# Patient Record
Sex: Female | Born: 1945
Health system: Southern US, Community
[De-identification: ages and names within clinical notes are randomized; demographics above are authoritative.]

## PROBLEM LIST (undated history)

## (undated) DIAGNOSIS — F0390 Unspecified dementia without behavioral disturbance: Secondary | ICD-10-CM

## (undated) DIAGNOSIS — F32A Depression, unspecified: Secondary | ICD-10-CM

## (undated) HISTORY — PX: ABDOMINAL HYSTERECTOMY: SHX81

---

## 2019-02-17 ENCOUNTER — Other Ambulatory Visit: Payer: Self-pay | Admitting: Neurology

## 2019-02-17 DIAGNOSIS — R413 Other amnesia: Secondary | ICD-10-CM

## 2019-03-02 ENCOUNTER — Other Ambulatory Visit: Payer: Self-pay

## 2019-03-02 ENCOUNTER — Ambulatory Visit
Admission: RE | Admit: 2019-03-02 | Discharge: 2019-03-02 | Disposition: A | Discharge status: Home / Self Care | Payer: Medicare Other | Source: Ambulatory Visit | Attending: Neurology | Admitting: Neurology

## 2019-03-02 DIAGNOSIS — R413 Other amnesia: Secondary | ICD-10-CM | POA: Diagnosis not present

## 2019-08-26 DIAGNOSIS — F329 Major depressive disorder, single episode, unspecified: Secondary | ICD-10-CM | POA: Diagnosis not present

## 2019-08-26 DIAGNOSIS — Z87891 Personal history of nicotine dependence: Secondary | ICD-10-CM | POA: Diagnosis not present

## 2019-08-26 DIAGNOSIS — F0281 Dementia in other diseases classified elsewhere with behavioral disturbance: Secondary | ICD-10-CM | POA: Diagnosis not present

## 2019-08-26 DIAGNOSIS — E538 Deficiency of other specified B group vitamins: Secondary | ICD-10-CM | POA: Diagnosis not present

## 2019-08-26 DIAGNOSIS — Z0000 Encounter for general adult medical examination without abnormal findings: Secondary | ICD-10-CM | POA: Diagnosis not present

## 2019-08-26 DIAGNOSIS — G301 Alzheimer's disease with late onset: Secondary | ICD-10-CM | POA: Diagnosis not present

## 2019-11-24 DIAGNOSIS — Z0000 Encounter for general adult medical examination without abnormal findings: Secondary | ICD-10-CM | POA: Diagnosis not present

## 2019-11-24 DIAGNOSIS — Z79899 Other long term (current) drug therapy: Secondary | ICD-10-CM | POA: Diagnosis not present

## 2019-11-24 DIAGNOSIS — E538 Deficiency of other specified B group vitamins: Secondary | ICD-10-CM | POA: Diagnosis not present

## 2020-01-11 ENCOUNTER — Emergency Department
Admission: EM | Admit: 2020-01-11 | Discharge: 2020-01-11 | Disposition: A | Discharge status: Home / Self Care | Payer: PPO | Attending: Emergency Medicine | Admitting: Emergency Medicine

## 2020-01-11 ENCOUNTER — Emergency Department: Payer: PPO

## 2020-01-11 ENCOUNTER — Other Ambulatory Visit: Payer: Self-pay

## 2020-01-11 DIAGNOSIS — F0391 Unspecified dementia with behavioral disturbance: Secondary | ICD-10-CM | POA: Insufficient documentation

## 2020-01-11 DIAGNOSIS — R4182 Altered mental status, unspecified: Secondary | ICD-10-CM | POA: Diagnosis not present

## 2020-01-11 DIAGNOSIS — R451 Restlessness and agitation: Secondary | ICD-10-CM | POA: Diagnosis present

## 2020-01-11 LAB — CBC WITH DIFFERENTIAL/PLATELET
Abs Immature Granulocytes: 0.02 10*3/uL (ref 0.00–0.07)
Basophils Absolute: 0.1 10*3/uL (ref 0.0–0.1)
Basophils Relative: 1 %
Eosinophils Absolute: 0.1 10*3/uL (ref 0.0–0.5)
Eosinophils Relative: 3 %
HCT: 36.1 % (ref 36.0–46.0)
Hemoglobin: 12.6 g/dL (ref 12.0–15.0)
Immature Granulocytes: 0 %
Lymphocytes Relative: 33 %
Lymphs Abs: 1.9 10*3/uL (ref 0.7–4.0)
MCH: 31.7 pg (ref 26.0–34.0)
MCHC: 34.9 g/dL (ref 30.0–36.0)
MCV: 90.7 fL (ref 80.0–100.0)
Monocytes Absolute: 0.5 10*3/uL (ref 0.1–1.0)
Monocytes Relative: 8 %
Neutro Abs: 3.1 10*3/uL (ref 1.7–7.7)
Neutrophils Relative %: 55 %
Platelets: 168 10*3/uL (ref 150–400)
RBC: 3.98 MIL/uL (ref 3.87–5.11)
RDW: 13.6 % (ref 11.5–15.5)
WBC: 5.6 10*3/uL (ref 4.0–10.5)
nRBC: 0 % (ref 0.0–0.2)

## 2020-01-11 LAB — COMPREHENSIVE METABOLIC PANEL
ALT: 18 U/L (ref 0–44)
AST: 21 U/L (ref 15–41)
Albumin: 3.8 g/dL (ref 3.5–5.0)
Alkaline Phosphatase: 74 U/L (ref 38–126)
Anion gap: 9 (ref 5–15)
BUN: 21 mg/dL (ref 8–23)
CO2: 23 mmol/L (ref 22–32)
Calcium: 9.2 mg/dL (ref 8.9–10.3)
Chloride: 106 mmol/L (ref 98–111)
Creatinine, Ser: 0.94 mg/dL (ref 0.44–1.00)
GFR calc Af Amer: >60 mL/min (ref >60)
GFR calc non Af Amer: 60 mL/min — ABNORMAL LOW (ref >60)
Glucose, Bld: 107 mg/dL — ABNORMAL HIGH (ref 70–99)
Potassium: 3.9 mmol/L (ref 3.5–5.1)
Sodium: 138 mmol/L (ref 135–145)
Total Bilirubin: 0.7 mg/dL (ref 0.3–1.2)
Total Protein: 6.5 g/dL (ref 6.5–8.1)

## 2020-01-11 LAB — TROPONIN I (HIGH SENSITIVITY): Troponin I (High Sensitivity): <2 ng/L (ref <18)

## 2020-01-11 LAB — T4, FREE: Free T4: 0.73 ng/dL (ref 0.61–1.12)

## 2020-01-11 LAB — TSH: TSH: 3.365 u[IU]/mL (ref 0.350–4.500)

## 2020-01-11 MED ORDER — LORAZEPAM 1 MG PO TABS
1.0000 mg | ORAL_TABLET | Freq: Once | ORAL | Status: AC
  Administered 2020-01-11: 1 mg via ORAL
  Filled 2020-01-11: qty 1

## 2020-01-11 MED ORDER — LORAZEPAM 1 MG PO TABS
1.0000 mg | ORAL_TABLET | Freq: Two times a day (BID) | ORAL | 0 refills | Status: AC | PRN
Start: 2020-01-11 — End: 2021-01-10

## 2020-01-11 MED ORDER — LORAZEPAM 2 MG/ML IJ SOLN
1.0000 mg | Freq: Once | INTRAMUSCULAR | Status: AC
  Administered 2020-01-11: 1 mg via INTRAVENOUS
  Filled 2020-01-11: qty 1

## 2020-01-11 NOTE — ED Provider Notes (Signed)
ER Provider Note       Time seen: 5:30 PM    I have reviewed the vital signs and the nursing notes.  HISTORY   Chief Complaint Altered Mental Status   HPI Tina Jackson is a 74 y.o. female with a history of dementia who presents today for agitation and irrational behavior.  Patient has been acting this way for a while now, normally the husband is able to get her under control but the last few days he has not been able to.  They deny any recent illness or other complaints.  History reviewed. No pertinent past medical history.  Allergies Patient has no known allergies.  Review of Systems Constitutional: Negative for fever. Cardiovascular: Negative for chest pain. Respiratory: Negative for shortness of breath. Gastrointestinal: Negative for abdominal pain, vomiting and diarrhea. Musculoskeletal: Negative for back pain. Skin: Negative for rash. Neurological: Negative for headaches, focal weakness or numbness. Psychiatric: Positive for irrational behavior, agitation  All systems negative/normal/unremarkable except as stated in the HPI  ____________________________________________   PHYSICAL EXAM:  VITAL SIGNS: Vitals:   01/11/20 1727  BP: 126/79  Pulse: 70  Resp: 20  SpO2: 99%    Constitutional: Alert but disoriented, mildly agitated Eyes: Conjunctivae are injected bilaterally ENT      Head: Normocephalic and atraumatic.      Nose: No congestion/rhinnorhea.      Mouth/Throat: Mucous membranes are moist.      Neck: No stridor. Cardiovascular: Normal rate, regular rhythm. No murmurs, rubs, or gallops. Respiratory: Normal respiratory effort without tachypnea nor retractions. Breath sounds are clear and equal bilaterally. No wheezes/rales/rhonchi. Gastrointestinal: Soft and nontender. Normal bowel sounds Musculoskeletal: Nontender with normal range of motion in extremities. No lower extremity tenderness nor edema. Neurologic:  Normal speech and language. No gross  focal neurologic deficits are appreciated.  Skin:  Skin is warm, dry and intact. No rash noted. Psychiatric: Speech and behavior are normal.  ____________________________________________  EKG: Interpreted by me.  Sinus rhythm with rate of 66 bpm, possible septal infarct age-indeterminate, normal QT  ____________________________________________   LABS (pertinent positives/negatives)  Labs Reviewed  COMPREHENSIVE METABOLIC PANEL - Abnormal; Notable for the following components:      Result Value   Glucose, Bld 107 (*)    GFR calc non Af Amer 60 (*)    All other components within normal limits  CBC WITH DIFFERENTIAL/PLATELET  TSH  T4, FREE  URINALYSIS, COMPLETE (UACMP) WITH MICROSCOPIC  CBG MONITORING, ED  TROPONIN I (HIGH SENSITIVITY)    RADIOLOGY  Images were viewed by me CT head IMPRESSION: No acute abnormality. Stable atrophy and chronic small vessel ischemic changes. The atrophy is most prominent in the temporal lobes.   DIFFERENTIAL DIAGNOSIS  Dementia with behavioral disturbance, dehydration, electrolyte abnormality, occult infection  ASSESSMENT AND PLAN  Dementia with behavioral disturbance   Plan: The patient had presented for dementia with behavioral disturbance.  She was agitated on arrival and given IV Ativan.  This helped her symptoms dramatically.  Her labs were reassuring.  I will prescribe Ativan that the husband can give to her as needed.  He does not feel like geriatric psychiatric placement is needed at this time.  Lenise Arena MD    Note: This note was generated in part or whole with voice recognition software. Voice recognition is usually quite accurate but there are transcription errors that can and very often do occur. I apologize for any typographical errors that were not detected and corrected.  Emily Filbert, MD 01/11/20 [phone]

## 2020-01-11 NOTE — ED Notes (Signed)
Assisting pt ot commode to attempt to get urine sample

## 2020-01-11 NOTE — ED Triage Notes (Signed)
PT brought to ED from home with husband. PT was initially refusing to come in and be seen. Williams MD saw pt in parking lot and convinced her to come back.  PT has been acting "irrational" for a while now, normally husband is able to get it under control but last few days has not been able to.

## 2020-01-17 ENCOUNTER — Emergency Department
Admission: EM | Admit: 2020-01-17 | Discharge: 2020-01-17 | Disposition: A | Discharge status: Home / Self Care | Payer: PPO | Attending: Student | Admitting: Student

## 2020-01-17 ENCOUNTER — Other Ambulatory Visit: Payer: Self-pay

## 2020-01-17 DIAGNOSIS — F0391 Unspecified dementia with behavioral disturbance: Secondary | ICD-10-CM | POA: Insufficient documentation

## 2020-01-17 DIAGNOSIS — F0390 Unspecified dementia without behavioral disturbance: Secondary | ICD-10-CM | POA: Diagnosis not present

## 2020-01-17 DIAGNOSIS — F03911 Unspecified dementia, unspecified severity, with agitation: Secondary | ICD-10-CM

## 2020-01-17 DIAGNOSIS — R451 Restlessness and agitation: Secondary | ICD-10-CM | POA: Insufficient documentation

## 2020-01-17 DIAGNOSIS — R9431 Abnormal electrocardiogram [ECG] [EKG]: Secondary | ICD-10-CM | POA: Diagnosis not present

## 2020-01-17 LAB — CBC
HCT: 35.8 % — ABNORMAL LOW (ref 36.0–46.0)
Hemoglobin: 12.1 g/dL (ref 12.0–15.0)
MCH: 30.9 pg (ref 26.0–34.0)
MCHC: 33.8 g/dL (ref 30.0–36.0)
MCV: 91.3 fL (ref 80.0–100.0)
Platelets: 171 10*3/uL (ref 150–400)
RBC: 3.92 MIL/uL (ref 3.87–5.11)
RDW: 13.6 % (ref 11.5–15.5)
WBC: 4 10*3/uL (ref 4.0–10.5)
nRBC: 0 % (ref 0.0–0.2)

## 2020-01-17 LAB — URINALYSIS, COMPLETE (UACMP) WITH MICROSCOPIC
Bacteria, UA: NONE SEEN
Bilirubin Urine: NEGATIVE
Glucose, UA: NEGATIVE mg/dL
Hgb urine dipstick: NEGATIVE
Ketones, ur: NEGATIVE mg/dL
Leukocytes,Ua: NEGATIVE
Nitrite: NEGATIVE
Protein, ur: NEGATIVE mg/dL
Specific Gravity, Urine: 1.017 (ref 1.005–1.030)
Squamous Epithelial / HPF: NONE SEEN (ref 0–5)
WBC, UA: NONE SEEN WBC/hpf (ref 0–5)
pH: 5 (ref 5.0–8.0)

## 2020-01-17 LAB — COMPREHENSIVE METABOLIC PANEL
ALT: 19 U/L (ref 0–44)
AST: 15 U/L (ref 15–41)
Albumin: 3.9 g/dL (ref 3.5–5.0)
Alkaline Phosphatase: 69 U/L (ref 38–126)
Anion gap: 9 (ref 5–15)
BUN: 24 mg/dL — ABNORMAL HIGH (ref 8–23)
CO2: 25 mmol/L (ref 22–32)
Calcium: 9.1 mg/dL (ref 8.9–10.3)
Chloride: 104 mmol/L (ref 98–111)
Creatinine, Ser: 0.96 mg/dL (ref 0.44–1.00)
GFR calc Af Amer: >60 mL/min (ref >60)
GFR calc non Af Amer: 58 mL/min — ABNORMAL LOW (ref >60)
Glucose, Bld: 99 mg/dL (ref 70–99)
Potassium: 4 mmol/L (ref 3.5–5.1)
Sodium: 138 mmol/L (ref 135–145)
Total Bilirubin: 1 mg/dL (ref 0.3–1.2)
Total Protein: 6.5 g/dL (ref 6.5–8.1)

## 2020-01-17 MED ORDER — OLANZAPINE 5 MG PO TABS
5.0000 mg | ORAL_TABLET | Freq: Every day | ORAL | 0 refills | Status: DC
Start: 2020-01-17 — End: 2021-02-22

## 2020-01-17 MED ORDER — OLANZAPINE 5 MG PO TBDP
5.0000 mg | ORAL_TABLET | Freq: Every day | ORAL | Status: DC
  Administered 2020-01-17: 5 mg via ORAL
  Filled 2020-01-17: qty 1

## 2020-01-17 NOTE — ED Notes (Signed)
Pt dressed out in wine colored scrubs. Personal belongings: sneakers, blue shirt, pants, underwear, and bra.

## 2020-01-17 NOTE — ED Notes (Signed)
Report to include Situation, Background, Assessment, and Recommendations received from RN. Patient alert and oriented, warm and dry, in no acute distress. Patient denies SI, HI, AVH and pain. Patient made aware of Q15 minute rounds and Rover and Officer presence for their safety. Patient instructed to come to me with needs or concerns.   

## 2020-01-17 NOTE — ED Triage Notes (Signed)
First nurse note- here for change in behavior. Husband reports pt trying to walk off and keeps trying to get out of car. Hx dementia.

## 2020-01-17 NOTE — ED Triage Notes (Signed)
Husband brought pt to ER d/t increase in agitation at home and trying to walk away/get out of car. Hx dementia. Husband states eating/drinking/using bathroom okay. Ambulatory. Confused in triage. Husband states was prescribed ativan last week and is making pt wobbly.

## 2020-01-17 NOTE — ED Notes (Signed)
Pt is confused and attempting to leave the area, husband is bedside and redirects her frequently.  Pt is cooperative and provided urine sample.

## 2020-01-17 NOTE — ED Notes (Signed)
Sandwich tray and ice water with no lid or straw were given to pt.

## 2020-01-17 NOTE — ED Notes (Signed)
Pt and husband given verbal and written discharge instruction per MD's order. Pt denies pain, SI, SIB and HI during Dx instructions.

## 2020-01-17 NOTE — Discharge Instructions (Addendum)
Thank you for letting us take care of you in the emergency department today.   Please stop taking the Ativan. She seemed to have a really good response to the medication here, called Zyprexa. We have written you a prescription for this, 5 mg, to take once a day. Talk to your doctor at your appointment tomorrow about adding this in to her medication regimen, to make sure it is safe w/ her other medications.   Please follow up with: Your primary care doctor to review your ER visit and follow up on your symptoms.  Neurology doctor, information/referral is below.  Please return to the ER for any new or worsening symptoms.

## 2020-01-17 NOTE — ED Provider Notes (Signed)
[facility] Emergency Department Provider Note  ____________________________________________   First MD Initiated Contact with Patient 01/17/20 1816     (approximate)  I have reviewed the triage vital signs and the nursing notes.  History  Chief Complaint Altered Mental Status and Agitation    HPI Courtney Bellizzi is a 74 y.o. female with a history of dementia, agitation, who presents to the emergency department with her husband at bedside who is concerned for worsening agitation.  Husband at bedside states that the patient's agitation and dementia has been worsening for quite some time. Constant, and slowly worsening over weeks to months. He says that she will often try and walk off, will try to get out of the car when she should not. Sometimes she is difficult to control when she gets confused. Requires excessive redirection. Husband says she has not had any significant improvement in her dementia with different medications she has been initiated on.  They were seen in this ER for some similar agitation recently, seemed to have some improvement with IV Ativan while here, and discharged with prescription for the same.  Husband states it initially seemed to help her a little bit, but mostly thinks it makes her more wobbly and groggy.  At this point, he does not desire any kind of in-home assistance or placement, he more so is looking for assistance with symptom management.  Also asking for a new referral to neurology for second opinion about management of her dementia.  Caveat: History primarily obtained from the husband at bedside due to the patient's dementia. She is calm, cooperative, and agreeable on my evaluation after dose of Zyprexa. Denies SI, HI, AVH or pain to her RN.    Past Medical Hx History reviewed. No pertinent past medical history.  Problem List There are no problems to display for this patient.   Past Surgical Hx Past Surgical History:   Procedure Laterality Date  . ABDOMINAL HYSTERECTOMY      Medications Prior to Admission medications   Medication Sig Start Date End Date Taking? Authorizing Provider  LORazepam (ATIVAN) 1 MG tablet Take 1 tablet (1 mg total) by mouth 2 (two) times daily as needed for anxiety. 01/11/20 01/10/21  Emily Filbert, MD    Allergies Patient has no known allergies.  Family Hx History reviewed. No pertinent family history.  Social Hx Social History   Tobacco Use  . Smoking status: Never Smoker  . Smokeless tobacco: Never Used  Substance Use Topics  . Alcohol use: Not Currently  . Drug use: Never     Review of Systems  Constitutional: Negative for fever. Negative for chills. Eyes: Negative for visual changes. ENT: Negative for sore throat. Cardiovascular: Negative for chest pain. Respiratory: Negative for shortness of breath. Gastrointestinal: Negative for nausea. Negative for vomiting.  Genitourinary: Negative for dysuria. Musculoskeletal: Negative for leg swelling. Skin: Negative for rash. Neurological: Negative for headaches. + agitation   Physical Exam  Vital Signs: ED Triage Vitals  Enc Vitals Group     BP 01/17/20 1730 (!) 112/95     Pulse Rate 01/17/20 1730 80     Resp 01/17/20 1730 18     Temp 01/17/20 1730 97.8 F (36.6 C)     Temp Source 01/17/20 1730 Oral     SpO2 01/17/20 1730 97 %     Weight 01/17/20 1733 105 lb (47.6 kg)     Height 01/17/20 1733 5\' 4"  (1.626 m)     Head Circumference --  Peak Flow --      Pain Score 01/17/20 1733 0     Pain Loc --      Pain Edu? --      Excl. in [place]? --     Constitutional: Awake and alert.  Calm, cooperative, laughing and agreeable after dose of oral Zyprexa. Head: Normocephalic. Atraumatic. Eyes: Conjunctivae clear. Sclera anicteric. Pupils equal and symmetric. Nose: No masses or lesions. No congestion or rhinorrhea. Mouth/Throat: Wearing mask.  Neck: No stridor. Trachea midline.  Cardiovascular:  Normal rate, regular rhythm. Extremities well perfused. Respiratory: Normal respiratory effort.  Lungs CTAB. Gastrointestinal: Soft. Non-distended. Non-tender.  Genitourinary: Deferred. Musculoskeletal: No lower extremity edema. No deformities. Neurologic:  Normal speech and language. No gross focal or lateralizing neurologic deficits are appreciated.  Consistent with history of dementia. Skin: Skin is warm, dry and intact. No rash noted. Psychiatric: Mood and affect are appropriate for situation.  Calm, cooperative, laughing and agreeable after dose of oral Zyprexa.  EKG  Personally reviewed and interpreted by myself.   Date: 01/17/2020 Time: 2051 Rate: 62 Rhythm: sinus Axis: normal Intervals: normal No STEMI      Procedures  Procedure(s) performed (including critical care):  Procedures   Initial Impression / Assessment and Plan / MDM / ED Course  75 y.o. female who presents to the ED for increased agitation, in the setting of history of dementia.  Ddx: worsening dementia, dementia with behavioral disturbances, electrolyte abnormality, UTI  Patient was seen here recently for similar symptoms on 5/31.  Extensive work-up at that time, including CT head and lab work was unremarkable.  We will repeat basic labs and urine studies here.  Will trial a dose of oral Zyprexa here to see if it helps with some of her agitation, which she is experiencing on arrival to the ER.  The patient and husband at bedside are in agreement with this.  Labs without actionable derangements.  UA negative for infection.  After dose of Zyprexa, husband states he has noticed a marked improvement in her symptoms.  Patient feels more relaxed as well. Relaxing comfortably in bed, enjoying conversation w/ husband and myself. Laughing.  RN notices improvement in patient's agitation as well.  Query if she would benefit from having Zyprexa as a daily medication, or at least PRN.  Again, the husband does not  desire any evaluation for placement at this time, and do not feel like the patient requires emergent psychiatric evaluation. Feel she is stable for discharge.  They have an appointment with their PCP tomorrow.  As such, will provide an Rx for Zyprexa, and have them discuss this option with their PCP tomorrow to ensure no other medication interaction. Advised cessation of Ativan given no further benefit and new Rx for Zyprexa.  Given new referral for Neurology per husband request.  Discussed return precautions.  Discussed w/ husband that should he/they require higher level of care for the patient, including in-home or placement assistance, the PCP or the ER is always available as needed.  He voices understanding and is comfortable w/ the plan and discharge.  Patient is comfortable as well.  _______________________________   As part of my medical decision making I have reviewed available labs, radiology tests, reviewed old records/performed chart review, obtained additional history from family.   Final Clinical Impression(s) / ED Diagnosis  Final diagnoses:  Agitation  Agitation due to dementia [facility])       Note:  This document was prepared using Dragon voice recognition software and may  include unintentional dictation errors.   Miguel Aschoff., MD 01/18/20 [phone]

## 2020-01-17 NOTE — ED Notes (Signed)
Hourly rounding reveals patient in room. No complaints, stable, in no acute distress. Q15 minute rounds and monitoring via Security Cameras to continue. 

## 2020-01-17 NOTE — ED Notes (Signed)
[facility]bow sent to lab. Pt denied needing to urinate at his time. Husband informed if pt states she needs to urinate to let staff member know.

## 2020-01-18 DIAGNOSIS — G301 Alzheimer's disease with late onset: Secondary | ICD-10-CM | POA: Diagnosis not present

## 2020-01-18 DIAGNOSIS — F0281 Dementia in other diseases classified elsewhere with behavioral disturbance: Secondary | ICD-10-CM | POA: Diagnosis not present

## 2020-09-13 DIAGNOSIS — B349 Viral infection, unspecified: Secondary | ICD-10-CM | POA: Diagnosis not present

## 2020-09-13 DIAGNOSIS — F0281 Dementia in other diseases classified elsewhere with behavioral disturbance: Secondary | ICD-10-CM | POA: Diagnosis not present

## 2020-09-13 DIAGNOSIS — Z0000 Encounter for general adult medical examination without abnormal findings: Secondary | ICD-10-CM | POA: Diagnosis not present

## 2020-09-13 DIAGNOSIS — Z03818 Encounter for observation for suspected exposure to other biological agents ruled out: Secondary | ICD-10-CM | POA: Diagnosis not present

## 2020-09-13 DIAGNOSIS — G301 Alzheimer's disease with late onset: Secondary | ICD-10-CM | POA: Diagnosis not present

## 2021-02-22 ENCOUNTER — Emergency Department
Admission: EM | Admit: 2021-02-22 | Discharge: 2021-02-22 | Disposition: A | Discharge status: Home / Self Care | Payer: PPO | Attending: Emergency Medicine | Admitting: Emergency Medicine

## 2021-02-22 ENCOUNTER — Other Ambulatory Visit: Payer: Self-pay

## 2021-02-22 ENCOUNTER — Encounter: Payer: Self-pay | Admitting: Emergency Medicine

## 2021-02-22 DIAGNOSIS — F0281 Dementia in other diseases classified elsewhere with behavioral disturbance: Secondary | ICD-10-CM

## 2021-02-22 DIAGNOSIS — F332 Major depressive disorder, recurrent severe without psychotic features: Secondary | ICD-10-CM

## 2021-02-22 DIAGNOSIS — G309 Alzheimer's disease, unspecified: Secondary | ICD-10-CM | POA: Insufficient documentation

## 2021-02-22 DIAGNOSIS — F0280 Dementia in other diseases classified elsewhere without behavioral disturbance: Secondary | ICD-10-CM | POA: Insufficient documentation

## 2021-02-22 DIAGNOSIS — G301 Alzheimer's disease with late onset: Secondary | ICD-10-CM | POA: Diagnosis not present

## 2021-02-22 DIAGNOSIS — R4182 Altered mental status, unspecified: Secondary | ICD-10-CM | POA: Diagnosis not present

## 2021-02-22 DIAGNOSIS — F0390 Unspecified dementia without behavioral disturbance: Secondary | ICD-10-CM | POA: Diagnosis not present

## 2021-02-22 DIAGNOSIS — F0391 Unspecified dementia with behavioral disturbance: Secondary | ICD-10-CM

## 2021-02-22 LAB — CBC WITH DIFFERENTIAL/PLATELET
Abs Immature Granulocytes: 0.02 10*3/uL (ref 0.00–0.07)
Basophils Absolute: 0 10*3/uL (ref 0.0–0.1)
Basophils Relative: 1 %
Eosinophils Absolute: 0.1 10*3/uL (ref 0.0–0.5)
Eosinophils Relative: 3 %
HCT: 38.1 % (ref 36.0–46.0)
Hemoglobin: 13.2 g/dL (ref 12.0–15.0)
Immature Granulocytes: 1 %
Lymphocytes Relative: 35 %
Lymphs Abs: 1.1 10*3/uL (ref 0.7–4.0)
MCH: 31.7 pg (ref 26.0–34.0)
MCHC: 34.6 g/dL (ref 30.0–36.0)
MCV: 91.6 fL (ref 80.0–100.0)
Monocytes Absolute: 0.3 10*3/uL (ref 0.1–1.0)
Monocytes Relative: 10 %
Neutro Abs: 1.6 10*3/uL — ABNORMAL LOW (ref 1.7–7.7)
Neutrophils Relative %: 50 %
Platelets: 143 10*3/uL — ABNORMAL LOW (ref 150–400)
RBC: 4.16 MIL/uL (ref 3.87–5.11)
RDW: 13.2 % (ref 11.5–15.5)
WBC: 3.2 10*3/uL — ABNORMAL LOW (ref 4.0–10.5)
nRBC: 0 % (ref 0.0–0.2)

## 2021-02-22 LAB — COMPREHENSIVE METABOLIC PANEL
ALT: 12 U/L (ref 0–44)
AST: 19 U/L (ref 15–41)
Albumin: 3.7 g/dL (ref 3.5–5.0)
Alkaline Phosphatase: 49 U/L (ref 38–126)
Anion gap: 11 (ref 5–15)
BUN: 15 mg/dL (ref 8–23)
CO2: 22 mmol/L (ref 22–32)
Calcium: 8.9 mg/dL (ref 8.9–10.3)
Chloride: 105 mmol/L (ref 98–111)
Creatinine, Ser: 0.62 mg/dL (ref 0.44–1.00)
GFR, Estimated: >60 mL/min (ref >60)
Glucose, Bld: 93 mg/dL (ref 70–99)
Potassium: 3.3 mmol/L — ABNORMAL LOW (ref 3.5–5.1)
Sodium: 138 mmol/L (ref 135–145)
Total Bilirubin: 1.7 mg/dL — ABNORMAL HIGH (ref 0.3–1.2)
Total Protein: 6.1 g/dL — ABNORMAL LOW (ref 6.5–8.1)

## 2021-02-22 MED ORDER — OLANZAPINE 5 MG PO TBDP
5.0000 mg | ORAL_TABLET | Freq: Two times a day (BID) | ORAL | Status: DC
  Administered 2021-02-22: 5 mg via ORAL
  Filled 2021-02-22: qty 1

## 2021-02-22 MED ORDER — LORAZEPAM 2 MG/ML IJ SOLN
2.0000 mg | Freq: Once | INTRAMUSCULAR | Status: AC
  Administered 2021-02-22: 2 mg via INTRAMUSCULAR
  Filled 2021-02-22: qty 1

## 2021-02-22 MED ORDER — OLANZAPINE 5 MG PO TBDP: 5.0000 mg | ORAL_TABLET | Freq: Two times a day (BID) | ORAL | 1 refills | Status: DC
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
## 2021-02-22 NOTE — ED Notes (Signed)
E-signature not working at this time. Pt husband verbalized understanding of D/C instructions, prescriptions and follow up care with no further questions at this time. Pt in NAD and ambulatory at time of D/C.

## 2021-02-22 NOTE — ED Notes (Signed)
Husband, sitter and ED MD at bedside.
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
## 2021-02-22 NOTE — ED Provider Notes (Signed)
Patient seen by Dr. Toni Amend, he will start antipsychotics to see if behavior can be improved to the point where patient can be taken care of at home, otherwise geropsych placement    [name], MD 02/22/21 1231

## 2021-02-23 ENCOUNTER — Emergency Department
Admission: EM | Admit: 2021-02-23 | Discharge: 2021-03-03 | Disposition: A | Discharge status: Home / Self Care | Payer: PPO | Attending: Emergency Medicine | Admitting: Emergency Medicine

## 2021-02-23 ENCOUNTER — Other Ambulatory Visit: Payer: Self-pay

## 2021-02-23 DIAGNOSIS — F0281 Dementia in other diseases classified elsewhere with behavioral disturbance: Secondary | ICD-10-CM | POA: Insufficient documentation

## 2021-02-23 DIAGNOSIS — F0391 Unspecified dementia with behavioral disturbance: Secondary | ICD-10-CM | POA: Diagnosis not present

## 2021-02-23 DIAGNOSIS — F02818 Dementia in other diseases classified elsewhere, unspecified severity, with other behavioral disturbance: Secondary | ICD-10-CM

## 2021-02-23 DIAGNOSIS — G301 Alzheimer's disease with late onset: Secondary | ICD-10-CM | POA: Diagnosis not present

## 2021-02-23 DIAGNOSIS — R9431 Abnormal electrocardiogram [ECG] [EKG]: Secondary | ICD-10-CM | POA: Diagnosis not present

## 2021-02-23 DIAGNOSIS — G309 Alzheimer's disease, unspecified: Secondary | ICD-10-CM | POA: Insufficient documentation

## 2021-02-23 DIAGNOSIS — R456 Violent behavior: Secondary | ICD-10-CM | POA: Diagnosis present

## 2021-02-23 LAB — TSH: TSH: 1.705 u[IU]/mL (ref 0.350–4.500)

## 2021-02-23 MED ORDER — OLANZAPINE 5 MG PO TBDP
5.0000 mg | ORAL_TABLET | Freq: Two times a day (BID) | ORAL | Status: DC
  Administered 2021-02-23 – 2021-03-03 (×16): 5 mg via ORAL
  Filled 2021-02-23 (×17): qty 1

## 2021-02-23 MED ORDER — ZIPRASIDONE MESYLATE 20 MG IM SOLR
20.0000 mg | Freq: Once | INTRAMUSCULAR | Status: AC
  Administered 2021-02-23: 20 mg via INTRAMUSCULAR
  Filled 2021-02-23: qty 20

## 2021-02-23 NOTE — BH Assessment (Signed)
Writer was unable to complete TTS consult because patient was unable to participate in the interview. Patient was giving IM medications.
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
## 2021-02-23 NOTE — BH Assessment (Signed)
Referral information for Psychiatric Hospitalization faxed to;   [facility] [phone]),  [facility] [phone], [phone], [phone] or [phone]),   [facility] [phone] or [phone]),   [facility] [phone]).

## 2021-02-23 NOTE — ED Notes (Signed)
Husband and patient notified of urine sample ordered. Hat placed in toilet. Both verbalized understanding.

## 2021-02-23 NOTE — ED Notes (Signed)
Vol geri psych

## 2021-02-23 NOTE — ED Notes (Signed)
Patient is resting comfortably. 

## 2021-02-23 NOTE — ED Notes (Signed)
Patient is resting comfortably. ED NT at bedside.

## 2021-02-23 NOTE — ED Provider Notes (Signed)
-----------------------------------------   2:59 PM on 02/23/2021 ----------------------------------------- Patient is awaiting evaluation, I have briefly seen the patient and she is somewhat agitated, no longer redirectable, continues to try to leave.  Per record review it appears the patient will likely need [facility] psych placement.  We will dose 20 mg of Geodon for patient and staff safety.   Minna Antis, MD 02/23/21 1500
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
## 2021-02-23 NOTE — ED Notes (Signed)
Beth, ED NT at bedside to monitor patient. Patient redirectable at this time.

## 2021-02-23 NOTE — ED Notes (Signed)
Patient is resting on left - side comfortably. Eyes closed; respirations even non-labored; spouse updated.

## 2021-02-23 NOTE — ED Notes (Signed)
Upon assessment, patient tearful, agitated, and uncooperative. Patient attempting to get out of bed, leave the room. Husband at bedside.   Husband reports the patient was seen here yesterday for agitation, and was prescribed new medications, but was unable to take them r/t pharmacy formulary issues. Patient has a hx of psychiatric treatment per husband. Patient difficult to redirect. IM Geodon given by Laurice Record, RN.
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
## 2021-02-23 NOTE — ED Notes (Signed)
Patient is resting comfortably. Respirations even and unlabored. NADN.

## 2021-02-23 NOTE — ED Notes (Signed)
ED Provider at bedside. 

## 2021-02-24 DIAGNOSIS — F0391 Unspecified dementia with behavioral disturbance: Secondary | ICD-10-CM | POA: Diagnosis not present

## 2021-02-24 MED ORDER — LORAZEPAM 1 MG PO TABS
1.0000 mg | ORAL_TABLET | Freq: Once | ORAL | Status: AC
  Administered 2021-02-24: 1 mg via ORAL
  Filled 2021-02-24: qty 1

## 2021-02-24 MED ORDER — OLANZAPINE 5 MG PO TBDP
5.0000 mg | ORAL_TABLET | Freq: Once | ORAL | Status: AC
  Administered 2021-02-24: 5 mg via ORAL

## 2021-02-24 NOTE — ED Notes (Signed)
VOL, pending placement 

## 2021-02-24 NOTE — ED Notes (Signed)
Patient resting. Patient appears to be sleeping. ED NT at bedside.

## 2021-02-24 NOTE — ED Notes (Signed)
Sitter at bedside to assist with the patient.

## 2021-02-24 NOTE — ED Notes (Signed)
Pt able to ambulate from bed to recliner in the room.  Husband at bedside.  Patient given coffee.  Pt denies any other needs at this time.

## 2021-02-24 NOTE — ED Notes (Signed)
Pt presents at her door agitated, tearful and trying to walk out.  Husband and RN able to redirect patient back into her room currently.  Pt in NAd.

## 2021-02-24 NOTE — Consult Note (Signed)
[facility] Face-to-Face Psychiatry Consult   Reason for Consult: Consult for 75 year old woman to follow up with dementia Referring Physician: [facility] Patient Identification: Tina Jackson MRN:  [id number] Principal Diagnosis: Dementia of Alzheimer's type with behavioral disturbance (HCC) Diagnosis:  Principal Problem:   Dementia of Alzheimer's type with behavioral disturbance (HCC)   Total Time spent with patient: 30 minutes  Subjective:   Tina Jackson is a 75 y.o. female patient admitted with patient nonverbal.  Brought back to the hospital by her husband because of continued behavior problems.  HPI: See previous note.  History of dementia with progressive behavior problems including wandering and aggression towards her husband that is made it impossible for her to be managed at home by husband alone.  Attempts at medication management at home have not been successful.  Brought back to the hospital by husband for further stabilization and treatment  Past Psychiatric History: History of dementia worsening over years  Risk to Self:   Risk to Others:   Prior Inpatient Therapy:   Prior Outpatient Therapy:    Past Medical History: History reviewed. No pertinent past medical history.  Past Surgical History:  Procedure Laterality Date   ABDOMINAL HYSTERECTOMY     Family History: History reviewed. No pertinent family history. Family Psychiatric  History: See previous Social History:  Social History   Substance and Sexual Activity  Alcohol Use Not Currently     Social History   Substance and Sexual Activity  Drug Use Never    Social History   Socioeconomic History   Marital status: Married    Spouse name: Not on file   Number of children: Not on file   Years of education: Not on file   Highest education level: Not on file  Occupational History   Not on file  Tobacco Use   Smoking status: Never   Smokeless tobacco: Never  Substance and Sexual Activity   Alcohol use: Not  Currently   Drug use: Never   Sexual activity: Not on file  Other Topics Concern   Not on file  Social History Narrative   Not on file   Social Determinants of Health   Financial Resource Strain: Not on file  Food Insecurity: Not on file  Transportation Needs: Not on file  Physical Activity: Not on file  Stress: Not on file  Social Connections: Not on file   Additional Social History:    Allergies:  No Known Allergies  Labs: No results found for this or any previous visit (from the past 48 hour(s)).  Current Facility-Administered Medications  Medication Dose Route Frequency Provider Last Rate Last Admin   OLANZapine zydis (ZYPREXA) disintegrating tablet 5 mg  5 mg Oral BID Arnaldo Natal, MD   5 mg at 02/24/21 1043   Current Outpatient Medications  Medication Sig Dispense Refill   FLUoxetine (PROZAC) 20 MG/5ML solution Take by mouth.     LORazepam (ATIVAN) 1 MG tablet Take 1 mg by mouth 2 (two) times daily.     OLANZapine zydis (ZYPREXA) 5 MG disintegrating tablet Take 1 tablet (5 mg total) by mouth 2 (two) times daily. 60 tablet 1    Musculoskeletal: Strength & Muscle Tone: within normal limits Gait & Station: normal Patient leans: N/A            Psychiatric Specialty Exam:  Presentation  General Appearance:  No data recorded Eye Contact: No data recorded Speech: No data recorded Speech Volume: No data recorded Handedness: No data recorded  Mood and  Affect  Mood: No data recorded Affect: No data recorded  Thought Process  Thought Processes: No data recorded Descriptions of Associations:No data recorded Orientation:No data recorded Thought Content:No data recorded History of Schizophrenia/Schizoaffective disorder:No data recorded Duration of Psychotic Symptoms:No data recorded Hallucinations:No data recorded Ideas of Reference:No data recorded Suicidal Thoughts:No data recorded Homicidal Thoughts:No data recorded  Sensorium   Memory: No data recorded Judgment: No data recorded Insight: No data recorded  Executive Functions  Concentration: No data recorded Attention Span: No data recorded Recall: No data recorded Fund of Knowledge: No data recorded Language: No data recorded  Psychomotor Activity  Psychomotor Activity: No data recorded  Assets  Assets: No data recorded  Sleep  Sleep: No data recorded  Physical Exam: Physical Exam Vitals and nursing note reviewed.  Constitutional:      Appearance: Normal appearance.  HENT:     Head: Normocephalic and atraumatic.     Mouth/Throat:     Pharynx: Oropharynx is clear.  Eyes:     Pupils: Pupils are equal, round, and reactive to light.  Cardiovascular:     Rate and Rhythm: Normal rate and regular rhythm.  Pulmonary:     Effort: Pulmonary effort is normal.     Breath sounds: Normal breath sounds.  Abdominal:     General: Abdomen is flat.     Palpations: Abdomen is soft.  Musculoskeletal:        General: Normal range of motion.  Skin:    General: Skin is warm and dry.  Neurological:     General: No focal deficit present.     Mental Status: She is alert. Mental status is at baseline.  Psychiatric:        Attention and Perception: She is inattentive.        Mood and Affect: Affect is blunt.        Speech: She is noncommunicative.   Review of Systems  Unable to perform ROS: Patient nonverbal  Blood pressure (!) 114/94, pulse 88, temperature 98 F (36.7 C), temperature source Oral, resp. rate 17, height 5\' 4"  (1.626 m), weight 46 kg, SpO2 98 %. Body mass index is 17.41 kg/m.  Treatment Plan Summary: Medication management and Plan this morning when I came to see the patient she was awake sitting up in a chair smiling.  Husband reported that her behavior had been improved a bit today seems to have slept a little better last night.  No evidence of any side effects from Zyprexa.  Efforts he made to manage her with medication at home  will been unsuccessful.  We continue to recommend geriatric psychiatry admission for further stabilization prior to either placement or return home.  No change to current medicine of olanzapine 5 mg twice a day  Disposition: Recommend psychiatric Inpatient admission when medically cleared. Supportive therapy provided about ongoing stressors.  , MD 02/24/2021 6:17 PM

## 2021-02-24 NOTE — ED Provider Notes (Signed)
Emergency Medicine Observation Re-evaluation Note  Tina Jackson is a 75 y.o. female, seen on rounds today.  Pt initially presented to the ED for complaints of Agitation Currently, the patient is resting, voices no medical complaints.  Physical Exam  BP 124/82 (BP Location: Right Arm)   Pulse 69   Temp 97.9 F (36.6 C) (Oral)   Resp 14   Ht 5\' 4"  (1.626 m)   Wt 46 kg   SpO2 97%   BMI 17.41 kg/m  Physical Exam General: Resting in no acute distress Cardiac: No cyanosis Lungs: Equal rise and fall Psych: Not agitated  ED Course / MDM  EKG:   I have reviewed the labs performed to date as well as medications administered while in observation.  Recent changes in the last 24 hours include no events overnight.  Plan  Current plan is for [facility] psych placement. Patient is not under full IVC at this time.   [facility], MD 02/24/21 [phone]

## 2021-02-24 NOTE — BH Assessment (Signed)
Referral information for Psychiatric Hospitalization faxed to;    [facility] [phone]), left a voicemail message requesting a return phone call.   [facility] [phone], [phone], [phone] or [phone]), unable to reach anyone.   [place] [phone] or [phone]), no longer reviewing referrals   [facility] [phone]). unable to reach anyone  [facility] [phone] -or- [phone]), refaxed.

## 2021-02-24 NOTE — ED Provider Notes (Signed)
-----------------------------------------   11:19 PM on 02/24/2021 -----------------------------------------  The patient has been placed in psychiatric observation due to the need to provide a safe environment for the patient while obtaining psychiatric consultation and evaluation, as well as ongoing medical and medication management to treat the patient's condition.  The patient has not been placed under full IVC at this time.    Loleta Rose, MD 02/24/21 [phone]

## 2021-02-24 NOTE — ED Notes (Signed)
VOL/Pending Placement 

## 2021-02-25 DIAGNOSIS — G308 Other Alzheimer's disease: Secondary | ICD-10-CM

## 2021-02-25 DIAGNOSIS — F0391 Unspecified dementia with behavioral disturbance: Secondary | ICD-10-CM | POA: Diagnosis not present

## 2021-02-25 DIAGNOSIS — F0281 Dementia in other diseases classified elsewhere with behavioral disturbance: Secondary | ICD-10-CM | POA: Diagnosis not present

## 2021-02-25 MED ORDER — LORAZEPAM 1 MG PO TABS
1.0000 mg | ORAL_TABLET | Freq: Once | ORAL | Status: AC
  Administered 2021-02-25: 1 mg via ORAL
  Filled 2021-02-25: qty 1

## 2021-02-25 MED ORDER — LORAZEPAM 1 MG PO TABS
1.0000 mg | ORAL_TABLET | Freq: Four times a day (QID) | ORAL | Status: AC | PRN
  Administered 2021-02-25 – 2021-02-27 (×5): 1 mg via ORAL
  Filled 2021-02-25 (×5): qty 1

## 2021-02-25 NOTE — ED Notes (Addendum)
Attempted to get VS at this time. Pt is unable to sit still at this time. Pt removing monitors and trying to stand, pt is not easily redirectable.

## 2021-02-25 NOTE — ED Provider Notes (Signed)
Emergency Medicine Observation Re-evaluation Note  Tina Jackson is a 74 y.o. female, seen on rounds today.  Pt initially presented to the ED for complaints of Agitation Currently, the patient is resting.  Physical Exam  BP (!) 114/94 (BP Location: Right Arm)   Pulse 88   Temp 98 F (36.7 C) (Oral)   Resp 17   Ht 1.626 m (5\' 4" )   Wt 46 kg   SpO2 98%   BMI 17.41 kg/m  Physical Exam Gen:  No acute distress Resp:  Breathing easily and comfortably, no accessory muscle usage Neuro:  Moving all four extremities, no gross focal neuro deficits Psych:  Resting currently, frequently agitated and walking around the room when awake but redirectable.  ED Course / MDM  EKG:  I have reviewed the labs performed to date as well as medications administered while in observation.  Recent changes in the last 24 hours include no significant changes.  Plan  Current plan is for geropsych placement (inpatient). Patient is not under full IVC at this time.   , MD 02/25/21 [phone]

## 2021-02-25 NOTE — ED Notes (Signed)
Psychiatrist team at bed.

## 2021-02-25 NOTE — ED Notes (Addendum)
Pt pacing in the room trying to open room door, pt keeps pushing door and not easily redirectable. Sitter at bedside. Dr. Cyril Loosen MD

## 2021-02-25 NOTE — ED Notes (Signed)
Pt changed into a dry brief as well as bottom washed. Gown also changed at this time.

## 2021-02-25 NOTE — Consult Note (Signed)
[facility] Face-to-Face Psychiatry Consult   Reason for Consult: Consult for 75 year old woman to follow up with dementia Referring Physician: [facility] Patient Identification: Tina Jackson MRN:  [id number] Principal Diagnosis: Dementia of Alzheimer's type with behavioral disturbance (HCC) Diagnosis:  Principal Problem:   Dementia of Alzheimer's type with behavioral disturbance (HCC)   Total Time spent with patient: 30 minutes  Subjective:   Tina Jackson is a 75 y.o. female patient admitted with patient nonverbal.  Brought back to the hospital by her husband because of continued behavior problems.  HPI: Patient continues to present with severe dementia. Husband at bedside and confirms that he is unable to care for her at home due to continued wandering and aggression. Husband confirms due to the severity of the medical illness he thinks for her safety she should be placed in a care facility. Patient is quiet, but unable to answer orientation questions or identify her husband. Does not appear to be responding to internal stimuli; husband denies any hallucinations, delusions, suicidal ideation or homicidal ideation. Behaviors are currently stable and consistent with Alzheimer's as the symptoms fluctuate.  HPI per Dr Toni Amend: See previous note.  History of dementia with progressive behavior problems including wandering and aggression towards her husband that is made it impossible for her to be managed at home by husband alone.  Attempts at medication management at home have not been successful.  Brought back to the hospital by husband for further stabilization and treatment  Past Psychiatric History: History of dementia worsening over years  Risk to Self:  yes due to wandering Risk to Others:  husband Prior Inpatient Therapy:  unknown Prior Outpatient Therapy:  PCP  Past Medical History: History reviewed. No pertinent past medical history.  Past Surgical History:  Procedure Laterality Date    ABDOMINAL HYSTERECTOMY     Family History: History reviewed. No pertinent family history. Family Psychiatric  History: See previous Social History:  Social History   Substance and Sexual Activity  Alcohol Use Not Currently     Social History   Substance and Sexual Activity  Drug Use Never    Social History   Socioeconomic History   Marital status: Married    Spouse name: Not on file   Number of children: Not on file   Years of education: Not on file   Highest education level: Not on file  Occupational History   Not on file  Tobacco Use   Smoking status: Never   Smokeless tobacco: Never  Substance and Sexual Activity   Alcohol use: Not Currently   Drug use: Never   Sexual activity: Not on file  Other Topics Concern   Not on file  Social History Narrative   Not on file   Social Determinants of Health   Financial Resource Strain: Not on file  Food Insecurity: Not on file  Transportation Needs: Not on file  Physical Activity: Not on file  Stress: Not on file  Social Connections: Not on file   Additional Social History:    Allergies:  No Known Allergies  Labs: No results found for this or any previous visit (from the past 48 hour(s)).  Current Facility-Administered Medications  Medication Dose Route Frequency Provider Last Rate Last Admin   LORazepam (ATIVAN) tablet 1 mg  1 mg Oral Q6H PRN Sharyn Creamer, MD       OLANZapine zydis (ZYPREXA) disintegrating tablet 5 mg  5 mg Oral BID Arnaldo Natal, MD   5 mg at 02/25/21 3329   Current  Outpatient Medications  Medication Sig Dispense Refill   FLUoxetine (PROZAC) 20 MG/5ML solution Take by mouth.     LORazepam (ATIVAN) 1 MG tablet Take 1 mg by mouth 2 (two) times daily.     OLANZapine zydis (ZYPREXA) 5 MG disintegrating tablet Take 1 tablet (5 mg total) by mouth 2 (two) times daily. 60 tablet 1    Musculoskeletal: Strength & Muscle Tone: within normal limits Gait & Station: normal Patient leans:  N/A  Psychiatric Specialty Exam: Physical Exam Vitals and nursing note reviewed.  Constitutional:      Appearance: Normal appearance.  HENT:     Head: Normocephalic and atraumatic.     Mouth/Throat:     Pharynx: Oropharynx is clear.  Eyes:     Pupils: Pupils are equal, round, and reactive to light.  Cardiovascular:     Rate and Rhythm: Normal rate and regular rhythm.  Pulmonary:     Effort: Pulmonary effort is normal.     Breath sounds: Normal breath sounds.  Abdominal:     General: Abdomen is flat.     Palpations: Abdomen is soft.  Musculoskeletal:        General: Normal range of motion.  Skin:    General: Skin is warm and dry.  Neurological:     General: No focal deficit present.     Mental Status: She is alert. Mental status is at baseline.  Psychiatric:        Attention and Perception: She is inattentive.        Mood and Affect: Affect is blunt.        Speech: She is noncommunicative.        Behavior: Behavior is slowed.        Cognition and Memory: Cognition is impaired. Memory is impaired.        Judgment: Judgment is impulsive.    Review of Systems  Unable to perform ROS: Patient nonverbal  Psychiatric/Behavioral:  Positive for memory loss.   All other systems reviewed and are negative.  Blood pressure (!) 114/94, pulse 88, temperature 98 F (36.7 C), temperature source Oral, resp. rate 17, height 5\' 4"  (1.626 m), weight 46 kg, SpO2 98 %.Body mass index is 17.41 kg/m.  General Appearance: Disheveled  Eye Contact:  Minimal  Speech:   Minimal  Volume:  Patient did not answer questions  Mood:   Reserved  Affect:  Flat  Thought Process:  NA  Orientation:  Negative  Thought Content:  NA  Suicidal Thoughts:  No  Homicidal Thoughts:  No  Memory:  Immediate;   Poor Recent;   Poor Remote;   Poor  Judgement:  Impaired  Insight:  Lacking  Psychomotor Activity:  Decreased  Concentration:  Concentration: Poor and Attention Span: Poor  Recall:  Poor  Fund of  Knowledge:  Poor  Language:  NA  Akathisia:  No  Handed:  Right  AIMS (if indicated):     Assets:  Social Support  ADL's:  Impaired  Cognition:  Impaired,  Severe  Sleep:        Physical Exam: Physical Exam Vitals and nursing note reviewed.  Constitutional:      Appearance: Normal appearance.  HENT:     Head: Normocephalic and atraumatic.     Mouth/Throat:     Pharynx: Oropharynx is clear.  Eyes:     Pupils: Pupils are equal, round, and reactive to light.  Cardiovascular:     Rate and Rhythm: Normal rate and regular rhythm.  Pulmonary:  Effort: Pulmonary effort is normal.     Breath sounds: Normal breath sounds.  Abdominal:     General: Abdomen is flat.     Palpations: Abdomen is soft.  Musculoskeletal:        General: Normal range of motion.  Skin:    General: Skin is warm and dry.  Neurological:     General: No focal deficit present.     Mental Status: She is alert. Mental status is at baseline.  Psychiatric:        Attention and Perception: She is inattentive.        Mood and Affect: Affect is blunt.        Speech: She is noncommunicative.        Behavior: Behavior is slowed.        Cognition and Memory: Cognition is impaired. Memory is impaired.        Judgment: Judgment is impulsive.   Review of Systems  Unable to perform ROS: Patient nonverbal  Psychiatric/Behavioral:  Positive for memory loss.   All other systems reviewed and are negative. Blood pressure (!) 114/94, pulse 88, temperature 98 F (36.7 C), temperature source Oral, resp. rate 17, height 5\' 4"  (1.626 m), weight 46 kg, SpO2 98 %. Body mass index is 17.41 kg/m.  Treatment Plan Summary: Per Dr. Medication management and Plan this morning when I came to see the patient she was awake sitting up in a chair smiling.  Husband reported that her behavior had been improved a bit today seems to have slept a little better last night.  No evidence of any side effects from Zyprexa.  Efforts he  made to manage her with medication at home will been unsuccessful.  We continue to recommend geriatric psychiatry admission for further stabilization prior to either placement or return home.  No change to current medicine of olanzapine 5 mg twice a day  Dementia with Behavioral Disturbances: Zyprexa Zydis 5 mg BID  Insomnia: Lorazepam 1 mg as needed every six hours   Disposition: Per Dr. Toni Amend  Recommend psychiatric Inpatient admission when medically cleared. Supportive therapy provided about ongoing stressors.  Toni Amend, NP 02/25/2021 5:17 PM

## 2021-02-25 NOTE — ED Notes (Signed)
Pt currently sleeping at this time. Lunch tray given to sitter at bedside.

## 2021-02-25 NOTE — ED Provider Notes (Signed)
Vitals:   02/23/21 2158 02/24/21 1620  BP: 124/82 (!) 114/94  Pulse: 69 88  Resp: 14 17  Temp:  98 F (36.7 C)  SpO2: 97% 98%      Patient has observed to be wandering about in the room.  She is requiring safety observer.  She seems to be finicky, not truly "agitated" but rather constantly wandering about.  Patient is constantly playing with her blanket, wondering about the room, having to be reoriented and frequently reminded.  On Zyprexa twice daily, seen by Dr. Toni Amend.  Was previously also treated with Ativan with success, will write for a few doses of as needed Ativan as well to help with calming.   Sharyn Creamer, MD 02/25/21 1626

## 2021-02-25 NOTE — ED Notes (Signed)
Pt placed in new brief at this time.

## 2021-02-26 NOTE — ED Notes (Signed)
Pt's dinner has arrived, pt sat up on her recliner. Pt eating now, will continue to monitor pt.

## 2021-02-26 NOTE — ED Notes (Signed)
VOL/pending placement 

## 2021-02-26 NOTE — ED Notes (Signed)
Report received from Crystal, RN

## 2021-02-26 NOTE — ED Notes (Signed)
Bed sheet changed at this time.

## 2021-02-26 NOTE — ED Notes (Signed)
Husband asked for medication for pt due to increased agitation.

## 2021-02-26 NOTE — ED Notes (Signed)
For most of shift, pt has been compliant and cooperative. Pt has had since 7a, 3 crying episodes. In which she brings her head down to her hands, cries slightly, tears fall and it lasts for a couple of minutes. After her episodes, she will look around and this writer has done the effort to give her emotional support everytime. Pt's husband states that it is completely normal and Clinical research associate has notified the RN about it. Pt currently sitting on her recliner, will continue to monitor pt.

## 2021-02-26 NOTE — BH Assessment (Signed)
Referral information for Psychiatric Hospitalization faxed to;    [facility] [phone]), left another voicemail message requesting a return phone call.   [facility] [phone], [phone], [phone] or [phone]), unable to reach anyone.   [place] [phone] or [phone]), no longer reviewing referrals   [facility] [phone]). unable to reach anyone   [facility] ([phone] -or- [phone]), declined due to lacking criteria.

## 2021-02-26 NOTE — ED Notes (Signed)
Pt's breakfast tray has arrived. Pt sitting on her recliner, eating her breakfast. Will continue to monitor pt.

## 2021-02-26 NOTE — ED Notes (Addendum)
Pt's husband at bedside since 0630 today,02/26/2021, per last night sitter's report. Will continue to monitor pt.

## 2021-02-26 NOTE — ED Notes (Signed)
Attempted VS on pt and she became agitated, grabbing at BP cuff. Unable to obtain vs at this time.

## 2021-02-26 NOTE — ED Notes (Signed)
Pt resting. Husband at bedside.

## 2021-02-26 NOTE — ED Notes (Signed)
Pt walking around and becoming slightly frustrated when she could not walk outside. Pt then continued to walk in circles, grabbed her husband and began crying while saying "I am so sorry, I am so sorry". Both Clinical research associate and pt's husband comforted pt. RN notified, will continue to monitor pt.

## 2021-02-27 DIAGNOSIS — R9431 Abnormal electrocardiogram [ECG] [EKG]: Secondary | ICD-10-CM | POA: Diagnosis not present

## 2021-02-27 DIAGNOSIS — F0391 Unspecified dementia with behavioral disturbance: Secondary | ICD-10-CM | POA: Diagnosis not present

## 2021-02-27 NOTE — ED Notes (Addendum)
Pt urinated on the floor at this time. Gown, sheet, and brief changed. Pt does not follow instructions and not easily redirectable.

## 2021-02-27 NOTE — ED Notes (Signed)
Pt calm and sitting in chair at this time. No distress noted. Sitter remains at bedside.

## 2021-02-27 NOTE — ED Provider Notes (Signed)
Emergency Medicine Observation Re-evaluation Note  Celinda Dethlefs is a 75 y.o. female, seen on rounds today.    Physical Exam  BP 133/83 (BP Location: Right Arm)   Pulse 60   Temp 98 F (36.7 C) (Oral)   Resp 18   Ht 5\' 4"  (1.626 m)   Wt 46 kg   SpO2 98%   BMI 17.41 kg/m  Physical Exam General: Patient sleeping comfortably in bed Lungs: Patient in no respiratory distress Psych: Patient currently not agitated  ED Course / MDM  EKG:EKG Interpretation  Date/Time:  Thursday February 23 2021 18:23:57 EDT Ventricular Rate:  68 PR Interval:    QRS Duration: 98 QT Interval:  416 QTC Calculation: 443 R Axis:   85 Text Interpretation: Atrial fibrillation Borderline right axis deviation ----------unconfirmed---------- Confirmed by UNCONFIRMED, DOCTOR (01-06-1984), editor [phone]) on 02/24/2021 12:35:58 PM  I  Plan  Current plan is for Geri psych placement Patient had required a sitter and has 1 but right now she is sleeping.  I will continue the sitter because she has been wondering and getting into problems when she is awake.   02/26/2021, MD 02/27/21 [phone]

## 2021-02-27 NOTE — ED Notes (Signed)
Report to Ashley, RN

## 2021-02-28 DIAGNOSIS — G301 Alzheimer's disease with late onset: Secondary | ICD-10-CM | POA: Diagnosis not present

## 2021-02-28 DIAGNOSIS — F0281 Dementia in other diseases classified elsewhere with behavioral disturbance: Secondary | ICD-10-CM | POA: Diagnosis not present

## 2021-02-28 DIAGNOSIS — F0391 Unspecified dementia with behavioral disturbance: Secondary | ICD-10-CM | POA: Diagnosis not present

## 2021-02-28 MED ORDER — LORAZEPAM 0.5 MG PO TABS
0.5000 mg | ORAL_TABLET | Freq: Once | ORAL | Status: AC
  Administered 2021-02-28: 0.5 mg via ORAL
  Filled 2021-02-28: qty 1

## 2021-02-28 MED ORDER — LORAZEPAM 1 MG PO TABS
1.0000 mg | ORAL_TABLET | Freq: Two times a day (BID) | ORAL | Status: DC | PRN
  Administered 2021-02-28 – 2021-03-02 (×5): 1 mg via ORAL
  Filled 2021-02-28 (×5): qty 1

## 2021-02-28 NOTE — BH Assessment (Signed)
TTS faxed patient referral with updated information.   Adult/GERO MH  Referral information for Psychiatric Hospitalization faxed to:   [facility] ([phone])   [facility] [phone]- [phone], [phone])   [place] [phone]), No female unit   [place] [phone] or [phone]),  . [facility] [phone] -or- [phone]),

## 2021-02-28 NOTE — ED Notes (Signed)
Pt to keep 1 on 1 sitter due to wondering and confusion

## 2021-02-28 NOTE — ED Notes (Signed)
Report received from Amy, RN including SBAR. Patient alert and oriented, warm and dry, and in no acute distress. Patient denies SI, HI, AVH and pain. Patient made aware of Q15 minute rounds and Rover and Officer presence for their safety. Patient instructed to come to this nurse with needs or concerns.  °

## 2021-02-28 NOTE — ED Notes (Signed)
Patient under direct obs by Peter Garter, ED NT.

## 2021-02-28 NOTE — ED Notes (Addendum)
Pt standing at door for the majority of the shift.  Needing constant re-direction from 1:1 sitter.  Husband at bedside.

## 2021-02-28 NOTE — ED Notes (Signed)
VOL  PENDING  PLACEMENT 

## 2021-02-28 NOTE — ED Notes (Signed)
Patient placed into hospital bed. Patient oriented to room. Patient denies needs. Eman, ED NT at bedside.

## 2021-02-28 NOTE — BH Assessment (Addendum)
Referral checks:    Tina Jackson ([phone]) No answer    [facility] [phone] -or- [phone], [phone]) Tina Jackson agreed to review referral and follow up with a callback.    [facility] [phone]), Per [place], the facility's geriatric unit is closed.    Tina Jackson [phone] or [phone]), Voicemail left requesting a call back   . [name] [phone]- [phone]), Per Tina Jackson pt denied due to Alzheimer's diagnosis.

## 2021-02-28 NOTE — ED Notes (Signed)
Pt given lunch tray and was able to eat, however, Pt choked on a piece of banana at the end of her meal. Pt was able to cough and remove the obstruction on her own. Pt's husband was present at the time and stated that Pt has never had trouble eating has not choked on her food before. Amy, RN and Will, NT made aware.

## 2021-02-28 NOTE — Consult Note (Signed)
[facility] Face-to-Face Psychiatry Consult   Reason for Consult: Follow-up for 75 year old woman with dementia awaiting referral to geriatric unit Referring Physician: Quale Patient Identification: [name] MRN:  [id number] Principal Diagnosis: Dementia of Alzheimer's type with behavioral disturbance (HCC) Diagnosis:  Principal Problem:   Dementia of Alzheimer's type with behavioral disturbance (HCC)   Total Time spent with patient: 30 minutes  Subjective:   Tina Jackson is a 75 y.o. female patient admitted with patient not verbal.  HPI: Patient seen.  Notes reviewed.  Patient nonverbal but awake alert and able to cooperate with nursing.  Eating adequately.  Appears to be tolerating olanzapine.  Has not been aggressive or violent here in the emergency room.  Past Psychiatric History: Past history of dementia with escalating behavior problems  Risk to Self:   Risk to Others:   Prior Inpatient Therapy:   Prior Outpatient Therapy:    Past Medical History: History reviewed. No pertinent past medical history.  Past Surgical History:  Procedure Laterality Date   ABDOMINAL HYSTERECTOMY     Family History: History reviewed. No pertinent family history. Family Psychiatric  History: See previous Social History:  Social History   Substance and Sexual Activity  Alcohol Use Not Currently     Social History   Substance and Sexual Activity  Drug Use Never    Social History   Socioeconomic History   Marital status: Married    Spouse name: Not on file   Number of children: Not on file   Years of education: Not on file   Highest education level: Not on file  Occupational History   Not on file  Tobacco Use   Smoking status: Never   Smokeless tobacco: Never  Substance and Sexual Activity   Alcohol use: Not Currently   Drug use: Never   Sexual activity: Not on file  Other Topics Concern   Not on file  Social History Narrative   Not on file   Social Determinants of Health    Financial Resource Strain: Not on file  Food Insecurity: Not on file  Transportation Needs: Not on file  Physical Activity: Not on file  Stress: Not on file  Social Connections: Not on file   Additional Social History:    Allergies:  No Known Allergies  Labs: No results found for this or any previous visit (from the past 48 hour(s)).  Current Facility-Administered Medications  Medication Dose Route Frequency Provider Last Rate Last Admin   OLANZapine zydis (ZYPREXA) disintegrating tablet 5 mg  5 mg Oral BID Arnaldo Natal, MD   5 mg at 02/28/21 3846   Current Outpatient Medications  Medication Sig Dispense Refill   FLUoxetine (PROZAC) 20 MG/5ML solution Take by mouth.     LORazepam (ATIVAN) 1 MG tablet Take 1 mg by mouth 2 (two) times daily.     OLANZapine zydis (ZYPREXA) 5 MG disintegrating tablet Take 1 tablet (5 mg total) by mouth 2 (two) times daily. 60 tablet 1    Musculoskeletal: Strength & Muscle Tone: within normal limits Gait & Station: normal Patient leans: N/A            Psychiatric Specialty Exam:  Presentation  General Appearance:  No data recorded Eye Contact: No data recorded Speech: No data recorded Speech Volume: No data recorded Handedness: No data recorded  Mood and Affect  Mood: No data recorded Affect: No data recorded  Thought Process  Thought Processes: No data recorded Descriptions of Associations:No data recorded Orientation:No data recorded Thought  Content:No data recorded History of Schizophrenia/Schizoaffective disorder:No data recorded Duration of Psychotic Symptoms:No data recorded Hallucinations:No data recorded Ideas of Reference:No data recorded Suicidal Thoughts:No data recorded Homicidal Thoughts:No data recorded  Sensorium  Memory: No data recorded Judgment: No data recorded Insight: No data recorded  Executive Functions  Concentration: No data recorded Attention Span: No data  recorded Recall: No data recorded Fund of Knowledge: No data recorded Language: No data recorded  Psychomotor Activity  Psychomotor Activity: No data recorded  Assets  Assets: No data recorded  Sleep  Sleep: No data recorded  Physical Exam: Physical Exam Vitals and nursing note reviewed.  Constitutional:      Appearance: Normal appearance.  HENT:     Head: Normocephalic and atraumatic.     Mouth/Throat:     Pharynx: Oropharynx is clear.  Eyes:     Pupils: Pupils are equal, round, and reactive to light.  Cardiovascular:     Rate and Rhythm: Normal rate and regular rhythm.  Pulmonary:     Effort: Pulmonary effort is normal.     Breath sounds: Normal breath sounds.  Abdominal:     General: Abdomen is flat.     Palpations: Abdomen is soft.  Musculoskeletal:        General: Normal range of motion.  Skin:    General: Skin is warm and dry.  Neurological:     General: No focal deficit present.     Mental Status: She is alert. Mental status is at baseline.  Psychiatric:        Speech: She is noncommunicative.   Review of Systems  Unable to perform ROS: Patient nonverbal  Blood pressure 128/78, pulse 68, temperature 98 F (36.7 C), temperature source Oral, resp. rate 17, height 5\' 4"  (1.626 m), weight 46 kg, SpO2 100 %. Body mass index is 17.41 kg/m.  Treatment Plan Summary: Medication management and Plan currently on 5 mg olanzapine twice a day tolerating it without any oversedation.  Not aggressive or violent here even without the husband present.  Continue to refer to geriatric psychiatry inpatient units.  Case reviewed with ER physician and with TTS who continue to work on appropriate referrals.  Disposition: Recommend psychiatric Inpatient admission when medically cleared.  , MD 02/28/2021 12:33 PM

## 2021-02-28 NOTE — ED Notes (Signed)
Pt husband speaks to nurse about placement, medications and room in ER. Able to answer all questions with up to date information. Husband appreciates this at this time.

## 2021-02-28 NOTE — BH Assessment (Signed)
Referral Checks:    Earlene Plater [phone]), left another voicemail message requesting a return phone call.   [facility] [phone], [phone] or [phone]), No behavioral health intake staff available until after 7am   [place] [phone] or [phone]), No answer   [facility] [phone]). unable to reach anyone   [facility] ([phone]- [phone]), declined due to lacking criteria (Per Previous TTS)

## 2021-02-28 NOTE — ED Notes (Signed)
Hourly rounding performed, patient currently awake in room. Patient has no complaints at this time. Q15 minute rounds and monitoring via Rover and Officer to continue. 

## 2021-02-28 NOTE — ED Notes (Signed)
Pt up and walking around room. Has had 2 crying episodes since 11:00am, lasting less than a minute. Pt is active, but not agitated. Will continue to monitor.

## 2021-02-28 NOTE — ED Provider Notes (Signed)
Emergency Medicine Observation Re-evaluation Note  Tina Jackson is a 75 y.o. female, seen on rounds today.  Pt initially presented to the ED for complaints of Agitation Currently, the patient is sleeping.  Physical Exam  BP 128/78   Pulse 68   Temp 98 F (36.7 C) (Oral)   Resp 17   Ht 5\' 4"  (1.626 m)   Wt 46 kg   SpO2 100%   BMI 17.41 kg/m  Physical Exam Gen: No acute distress  Resp: Normal rise and fall of chest Neuro: Moving all four extremities Psych: Resting currently, calm and cooperative when awake    ED Course / MDM  EKG:EKG Interpretation  Date/Time:  Thursday February 23 2021 18:23:57 EDT Ventricular Rate:  68 PR Interval:    QRS Duration: 98 QT Interval:  416 QTC Calculation: 443 R Axis:   85 Text Interpretation: Atrial fibrillation Borderline right axis deviation ----------unconfirmed---------- Confirmed by UNCONFIRMED, DOCTOR (01-06-1984), editor [id number] [phone]) on 02/24/2021 12:35:58 PM  I have reviewed the labs performed to date as well as medications administered while in observation.  Recent changes in the last 24 hours include no acute events overnight.  Plan  Current plan is for [facility] psych placement. Patient is not under full IVC at this time.   Lateesha Bezold, [facility], DO 02/28/21 [phone]

## 2021-02-28 NOTE — ED Notes (Signed)
Pt continues with increased agitation and restlessness, concern for pt safety is now arising. Pt husband reports pt does have bid prn ativan 1mg  rx at home. Dr. aware of situation and information now, order placed.

## 2021-02-28 NOTE — ED Notes (Signed)
This RN at bedside to introduce self to pt and husband. Pt currently resting with eyes closed. EDT 1:1 sitter at bedside for patient safety. Pt's husband denies any needs at this time.

## 2021-02-28 NOTE — ED Notes (Signed)
Pt's husband is wanting patient to be moved from quad back to a medical bed or discharged home.  Per the husband, the patient is too agitated in room 21.  RN explained there were no medical beds open at this time.  Dr. Toni Amend and EDP made aware.  Charge RN also aware of the situation.

## 2021-02-28 NOTE — ED Notes (Signed)
Patient is resting comfortably. 

## 2021-03-01 DIAGNOSIS — F0391 Unspecified dementia with behavioral disturbance: Secondary | ICD-10-CM | POA: Diagnosis not present

## 2021-03-01 DIAGNOSIS — G301 Alzheimer's disease with late onset: Secondary | ICD-10-CM | POA: Diagnosis not present

## 2021-03-01 DIAGNOSIS — F0281 Dementia in other diseases classified elsewhere with behavioral disturbance: Secondary | ICD-10-CM | POA: Diagnosis not present

## 2021-03-01 MED ORDER — OLANZAPINE 5 MG PO TBDP: 2.5000 mg | ORAL_TABLET | Freq: Four times a day (QID) | ORAL | Status: DC | PRN

## 2021-03-01 NOTE — BH Assessment (Signed)
Referral checks:    Earlene Plater [phone]) No answer    [facility] [phone]- [phone], [phone]) Per Morrie Sheldon no appropriate beds.    Sandre Kitty [phone] or [phone]), Voicemail left requesting a call back

## 2021-03-01 NOTE — ED Notes (Signed)
Patient resting, respirations even and unlabored. NADN. Eman, ED NT at bedside.

## 2021-03-01 NOTE — Consult Note (Signed)
[facility] Face-to-Face Psychiatry Consult   Reason for Consult: Follow-up consult 74 year old woman with dementia and has been waiting in the emergency room many days for referral to geriatric psychiatry Referring Physician: Fuller Plan Patient Identification: Tina Jackson MRN:  [id number] Principal Diagnosis: Dementia of Alzheimer's type with behavioral disturbance (HCC) Diagnosis:  Principal Problem:   Dementia of Alzheimer's type with behavioral disturbance (HCC)   Total Time spent with patient: 20 minutes  Subjective:   Tina Jackson is a 75 y.o. female patient admitted with patient has no complaint.  HPI: Follow-up with patient today.  She has been a little more agitated this afternoon.  Getting up trying to walk out of the room.  Not redirectable verbally.  Overall had shown some improvement since being here.  Tolerating medicine without any obvious impairment  Past Psychiatric History: Known history of dementia pretty severe nonverbal and aggressive at times  Risk to Self:   Risk to Others:   Prior Inpatient Therapy:   Prior Outpatient Therapy:    Past Medical History: History reviewed. No pertinent past medical history.  Past Surgical History:  Procedure Laterality Date   ABDOMINAL HYSTERECTOMY     Family History: History reviewed. No pertinent family history. Family Psychiatric  History: See previous Social History:  Social History   Substance and Sexual Activity  Alcohol Use Not Currently     Social History   Substance and Sexual Activity  Drug Use Never    Social History   Socioeconomic History   Marital status: Married    Spouse name: Not on file   Number of children: Not on file   Years of education: Not on file   Highest education level: Not on file  Occupational History   Not on file  Tobacco Use   Smoking status: Never   Smokeless tobacco: Never  Substance and Sexual Activity   Alcohol use: Not Currently   Drug use: Never   Sexual activity: Not on file   Other Topics Concern   Not on file  Social History Narrative   Not on file   Social Determinants of Health   Financial Resource Strain: Not on file  Food Insecurity: Not on file  Transportation Needs: Not on file  Physical Activity: Not on file  Stress: Not on file  Social Connections: Not on file   Additional Social History:    Allergies:  No Known Allergies  Labs: No results found for this or any previous visit (from the past 48 hour(s)).  Current Facility-Administered Medications  Medication Dose Route Frequency Provider Last Rate Last Admin   LORazepam (ATIVAN) tablet 1 mg  1 mg Oral BID PRN Arnaldo Natal, MD   1 mg at 03/01/21 1723   OLANZapine zydis (ZYPREXA) disintegrating tablet 2.5 mg  2.5 mg Oral Q6H PRN Scout Guyett, Jackquline Denmark, MD       OLANZapine zydis (ZYPREXA) disintegrating tablet 5 mg  5 mg Oral BID Arnaldo Natal, MD   5 mg at 03/01/21 6599   Current Outpatient Medications  Medication Sig Dispense Refill   FLUoxetine (PROZAC) 20 MG/5ML solution Take by mouth.     LORazepam (ATIVAN) 1 MG tablet Take 1 mg by mouth 2 (two) times daily.     OLANZapine zydis (ZYPREXA) 5 MG disintegrating tablet Take 1 tablet (5 mg total) by mouth 2 (two) times daily. 60 tablet 1    Musculoskeletal: Strength & Muscle Tone: within normal limits Gait & Station: normal Patient leans: N/A  Psychiatric Specialty Exam:  Presentation  General Appearance:  No data recorded Eye Contact: No data recorded Speech: No data recorded Speech Volume: No data recorded Handedness: No data recorded  Mood and Affect  Mood: No data recorded Affect: No data recorded  Thought Process  Thought Processes: No data recorded Descriptions of Associations:No data recorded Orientation:No data recorded Thought Content:No data recorded History of Schizophrenia/Schizoaffective disorder:No data recorded Duration of Psychotic Symptoms:No data recorded Hallucinations:No data  recorded Ideas of Reference:No data recorded Suicidal Thoughts:No data recorded Homicidal Thoughts:No data recorded  Sensorium  Memory: No data recorded Judgment: No data recorded Insight: No data recorded  Executive Functions  Concentration: No data recorded Attention Span: No data recorded Recall: No data recorded Fund of Knowledge: No data recorded Language: No data recorded  Psychomotor Activity  Psychomotor Activity: No data recorded  Assets  Assets: No data recorded  Sleep  Sleep: No data recorded  Physical Exam: Physical Exam Vitals and nursing note reviewed.  Constitutional:      Appearance: Normal appearance.  HENT:     Head: Normocephalic and atraumatic.     Mouth/Throat:     Pharynx: Oropharynx is clear.  Eyes:     Pupils: Pupils are equal, round, and reactive to light.  Cardiovascular:     Rate and Rhythm: Normal rate and regular rhythm.  Pulmonary:     Effort: Pulmonary effort is normal.     Breath sounds: Normal breath sounds.  Abdominal:     General: Abdomen is flat.     Palpations: Abdomen is soft.  Musculoskeletal:        General: Normal range of motion.  Skin:    General: Skin is warm and dry.  Neurological:     General: No focal deficit present.     Mental Status: She is alert. Mental status is at baseline.  Psychiatric:        Mood and Affect: Affect is blunt.        Speech: She is noncommunicative.        Cognition and Memory: Cognition is impaired.   Review of Systems  Unable to perform ROS: Patient nonverbal  Blood pressure 114/63, pulse 88, temperature 98 F (36.7 C), temperature source Oral, resp. rate 20, height 5\' 4"  (1.626 m), weight 46 kg, SpO2 99 %. Body mass index is 17.41 kg/m.  Treatment Plan Summary: Plan patient continues to wait for possible referral to geriatric psychiatry unit.  I put in an order to add a smaller dose of 2.5 mg of olanzapine as a as needed that can be used for agitation in between this  standard 5 mg doses.  Disposition: Recommend psychiatric Inpatient admission when medically cleared.  , MD 03/01/2021 7:02 PM

## 2021-03-01 NOTE — ED Provider Notes (Signed)
Emergency Medicine Observation Re-evaluation Note  Tina Jackson is a 75 y.o. female, seen on rounds today.  Pt initially presented to the ED for complaints of Agitation Currently, the patient is resting, voices no medical complaints.  Physical Exam  BP 122/70 (BP Location: Left Arm)   Pulse 77   Temp 98 F (36.7 C) (Oral)   Resp 20   Ht 5\' 4"  (1.626 m)   Wt 46 kg   SpO2 99%   BMI 17.41 kg/m  Physical Exam General: Resting in no acute distress Cardiac: No cyanosis Lungs: Equal rise and fall Psych: Not agitated  ED Course / MDM  EKG:EKG Interpretation  Date/Time:  Thursday February 23 2021 18:23:57 EDT Ventricular Rate:  68 PR Interval:    QRS Duration: 98 QT Interval:  416 QTC Calculation: 443 R Axis:   85 Text Interpretation: Atrial fibrillation Borderline right axis deviation ----------unconfirmed---------- Confirmed by UNCONFIRMED, DOCTOR (01-06-1984), editor [id number] [phone]) on 02/24/2021 12:35:58 PM  I have reviewed the labs performed to date as well as medications administered while in observation.  Recent changes in the last 24 hours include no events overnight.  Plan  Current plan is for [facility].  Tina Jackson is not under involuntary commitment.     Darel Hong, MD 03/01/21 [phone]

## 2021-03-01 NOTE — ED Notes (Signed)
VOL/Pending Placement 

## 2021-03-01 NOTE — ED Notes (Signed)
Sitter concerned that patient is overly tired and beginning to stumble occasionally and will not stay in bed. Will speak with the provider.

## 2021-03-01 NOTE — ED Notes (Signed)
Pt given several bites of apple sauce, lying in bed comfortably.  Sitter at bedside.

## 2021-03-01 NOTE — ED Notes (Signed)
Patient has been agitated for the past hour

## 2021-03-01 NOTE — ED Notes (Signed)
Patient respirations even and unlabored. Patient laying on left side, appears to be sleeping. ED NT Eman at bedside with patient.

## 2021-03-01 NOTE — ED Notes (Addendum)
RN notified by sitter that pt was becoming very agitated and restless. RN in to assess pt at this time. Pt clearly agitated, difficult to redirect. Pt moving blankets around and pulling at gown as if confused or disoriented. Pt and attempting to get out of bed to leave room. RN gave pt wash clothes and attempted to distract/occupy pt with folding hand towel. Pt unwilling to allow staff to obtain vitals at this time as well.

## 2021-03-02 DIAGNOSIS — G301 Alzheimer's disease with late onset: Secondary | ICD-10-CM | POA: Diagnosis not present

## 2021-03-02 DIAGNOSIS — F0391 Unspecified dementia with behavioral disturbance: Secondary | ICD-10-CM | POA: Diagnosis not present

## 2021-03-02 DIAGNOSIS — F0281 Dementia in other diseases classified elsewhere with behavioral disturbance: Secondary | ICD-10-CM | POA: Diagnosis not present

## 2021-03-02 NOTE — ED Notes (Signed)
Pt up and walking around room, not agitated. Meal tray brought by Mayra, EDT, breakfast refused by Pt at this time. Pt has returned to bed. Will continue to monitor.

## 2021-03-02 NOTE — ED Notes (Signed)
Pt eating some of her breakfast at this time.

## 2021-03-02 NOTE — ED Notes (Signed)
Pts husband called to inform him that pt is awake at this time.

## 2021-03-02 NOTE — BH Assessment (Signed)
Inpatient Referral Check Note Writer contacted multiple psych inpatient facilities for possible bed for the patient, but was unsuccessful with securing placement at this time. 

## 2021-03-02 NOTE — ED Notes (Signed)
Husband at bedside at this time.

## 2021-03-02 NOTE — ED Notes (Signed)
Husband at bedside.  

## 2021-03-02 NOTE — ED Notes (Signed)
Pt keeps trying to leave rm at this time. Pt has became very agitated. Pt wandering the room trying to mess with electrical sockets.

## 2021-03-02 NOTE — ED Notes (Signed)
Pt given warm blankets at this time 

## 2021-03-02 NOTE — ED Notes (Signed)
Pt given lunch tray.

## 2021-03-02 NOTE — ED Provider Notes (Signed)
Emergency Medicine Observation Re-evaluation Note  Nawaal Alling is a 75 y.o. female, seen on rounds today.  Pt initially presented to the ED for complaints of Agitation Currently, the patient is sleeping.  Physical Exam  BP 127/74 (BP Location: Left Arm)   Pulse 72   Temp 97.8 F (36.6 C) (Axillary)   Resp 20   Ht 5\' 4"  (1.626 m)   Wt 46 kg   SpO2 100%   BMI 17.41 kg/m  Physical Exam Gen: No acute distress  Resp: Normal rise and fall of chest Neuro: Moving all four extremities Psych: Resting currently, calm and cooperative when awake    ED Course / MDM  EKG:EKG Interpretation  Date/Time:  Thursday February 23 2021 18:23:57 EDT Ventricular Rate:  68 PR Interval:    QRS Duration: 98 QT Interval:  416 QTC Calculation: 443 R Axis:   85 Text Interpretation: Atrial fibrillation Borderline right axis deviation ----------unconfirmed---------- Confirmed by UNCONFIRMED, DOCTOR (01-06-1984), editor [phone]) on 02/24/2021 12:35:58 PM  I have reviewed the labs performed to date as well as medications administered while in observation.  Recent changes in the last 24 hours include no acute events.  Plan  Current plan is for [facility] psych placement.  Accalia Ozga is not under involuntary commitment.     Glendal Cassaday, Darel Hong, DO 03/02/21 [phone]

## 2021-03-02 NOTE — ED Notes (Signed)
Pt continuously attempting to leave rm, this writer having to redirect pt several times.

## 2021-03-02 NOTE — ED Notes (Signed)
Pt up and wandering about room for a few minutes, then returned to bed.

## 2021-03-02 NOTE — ED Notes (Signed)
Introduced self to pts husband-Ron. Pts husband leaving at this time d/t needing to run some errands and pt still sleeping; this Clinical research associate is to call Ron once pt awakens or if any needs arise.

## 2021-03-02 NOTE — ED Notes (Signed)
Pt up wondering in rm multiple times and then returns to bed.

## 2021-03-02 NOTE — ED Notes (Signed)
Report received from Jeannett Senior, [place]. Pt resting quietly in bed with eyes closed. Sitter and spouse in room with pt. Bed in lowest position and locked with bed rails up X 2. No S/S of distress noted.

## 2021-03-02 NOTE — ED Notes (Signed)
VOLUNTARY/pending placement 

## 2021-03-02 NOTE — ED Notes (Signed)
Pt observed to be OOB walking around in room at this time. Pt continues to refuse for BP to be taken. Pt otherwise cooperative with staff.

## 2021-03-02 NOTE — ED Notes (Signed)
Pt provided with warm blankets at this time.

## 2021-03-02 NOTE — ED Notes (Signed)
Pt ate 75% of her dinner tray.

## 2021-03-02 NOTE — ED Notes (Signed)
Pt given dinner tray. Pt eating her dinner an drinking juice

## 2021-03-02 NOTE — ED Notes (Signed)
Pt agitated an wondering the room. Pt was able to be redirected easily.Pt eating graham crackers, applesauce an drinking juice at this time.

## 2021-03-02 NOTE — Consult Note (Signed)
[facility] Face-to-Face Psychiatry Consult   Reason for Consult: Consult follow-up on this 75 year old woman with dementia.  Patient has been calmer since being in the emergency room although still has some periods of getting agitated.  Has been tolerating medication adequately. Referring Physician: Roxan Hockey Patient Identification: Tina Jackson MRN:  [id number] Principal Diagnosis: Dementia of Alzheimer's type with behavioral disturbance (HCC) Diagnosis:  Principal Problem:   Dementia of Alzheimer's type with behavioral disturbance (HCC)   Total Time spent with patient: 30 minutes  Subjective:   Tina Jackson is a 75 y.o. female patient admitted with patient not able to state.  HPI: Patient has been tolerating olanzapine and behavior has been improved although it is unknown whether this would continue when she was discharged home.  Patient has been referred out to multiple geriatric psychiatry units for several days now.  No bed offer has happened yet.  Spoke with husband today about the fact that the longer we go without a geriatric bed being offered often the less likely it is that one will come up.  Inquired of him whether he would feel safe with taking her home.  He continues to state that he is very worried she will wander away at home.  Past Psychiatric History: History of dementia  Risk to Self:   Risk to Others:   Prior Inpatient Therapy:   Prior Outpatient Therapy:    Past Medical History: History reviewed. No pertinent past medical history.  Past Surgical History:  Procedure Laterality Date   ABDOMINAL HYSTERECTOMY     Family History: History reviewed. No pertinent family history. Family Psychiatric  History: Unknown Social History:  Social History   Substance and Sexual Activity  Alcohol Use Not Currently     Social History   Substance and Sexual Activity  Drug Use Never    Social History   Socioeconomic History   Marital status: Married    Spouse name: Not on file    Number of children: Not on file   Years of education: Not on file   Highest education level: Not on file  Occupational History   Not on file  Tobacco Use   Smoking status: Never   Smokeless tobacco: Never  Substance and Sexual Activity   Alcohol use: Not Currently   Drug use: Never   Sexual activity: Not on file  Other Topics Concern   Not on file  Social History Narrative   Not on file   Social Determinants of Health   Financial Resource Strain: Not on file  Food Insecurity: Not on file  Transportation Needs: Not on file  Physical Activity: Not on file  Stress: Not on file  Social Connections: Not on file   Additional Social History:    Allergies:  No Known Allergies  Labs: No results found for this or any previous visit (from the past 48 hour(s)).  Current Facility-Administered Medications  Medication Dose Route Frequency Provider Last Rate Last Admin   LORazepam (ATIVAN) tablet 1 mg  1 mg Oral BID PRN Arnaldo Natal, MD   1 mg at 03/01/21 1723   OLANZapine zydis (ZYPREXA) disintegrating tablet 2.5 mg  2.5 mg Oral Q6H PRN Sanna Porcaro, Jackquline Denmark, MD       OLANZapine zydis (ZYPREXA) disintegrating tablet 5 mg  5 mg Oral BID Arnaldo Natal, MD   5 mg at 03/02/21 0930   Current Outpatient Medications  Medication Sig Dispense Refill   FLUoxetine (PROZAC) 20 MG/5ML solution Take by mouth.  LORazepam (ATIVAN) 1 MG tablet Take 1 mg by mouth 2 (two) times daily.     OLANZapine zydis (ZYPREXA) 5 MG disintegrating tablet Take 1 tablet (5 mg total) by mouth 2 (two) times daily. 60 tablet 1    Musculoskeletal: Strength & Muscle Tone: within normal limits Gait & Station: normal Patient leans: N/A            Psychiatric Specialty Exam:  Presentation  General Appearance:  No data recorded Eye Contact: No data recorded Speech: No data recorded Speech Volume: No data recorded Handedness: No data recorded  Mood and Affect  Mood: No data recorded Affect: No  data recorded  Thought Process  Thought Processes: No data recorded Descriptions of Associations:No data recorded Orientation:No data recorded Thought Content:No data recorded History of Schizophrenia/Schizoaffective disorder:No data recorded Duration of Psychotic Symptoms:No data recorded Hallucinations:No data recorded Ideas of Reference:No data recorded Suicidal Thoughts:No data recorded Homicidal Thoughts:No data recorded  Sensorium  Memory: No data recorded Judgment: No data recorded Insight: No data recorded  Executive Functions  Concentration: No data recorded Attention Span: No data recorded Recall: No data recorded Fund of Knowledge: No data recorded Language: No data recorded  Psychomotor Activity  Psychomotor Activity: No data recorded  Assets  Assets: No data recorded  Sleep  Sleep: No data recorded  Physical Exam: Physical Exam Vitals and nursing note reviewed.  Constitutional:      Appearance: Normal appearance.  HENT:     Head: Normocephalic and atraumatic.     Mouth/Throat:     Pharynx: Oropharynx is clear.  Eyes:     Pupils: Pupils are equal, round, and reactive to light.  Cardiovascular:     Rate and Rhythm: Normal rate and regular rhythm.  Pulmonary:     Effort: Pulmonary effort is normal.     Breath sounds: Normal breath sounds.  Abdominal:     General: Abdomen is flat.     Palpations: Abdomen is soft.  Musculoskeletal:        General: Normal range of motion.  Skin:    General: Skin is warm and dry.  Neurological:     General: No focal deficit present.     Mental Status: She is alert. Mental status is at baseline.  Psychiatric:        Attention and Perception: She is inattentive.        Speech: She is noncommunicative.   Review of Systems  Unable to perform ROS: Psychiatric disorder  Blood pressure 127/74, pulse (!) 51, temperature 97.8 F (36.6 C), temperature source Axillary, resp. rate 20, height 5\' 4"  (1.626 m),  weight 46 kg, SpO2 98 %. Body mass index is 17.41 kg/m.  Treatment Plan Summary: Medication management and Plan patient stable currently on the Zyprexa 5 mg twice a day with as needed 2.5 mg doses for agitation.  No geriatric bed has opened up yet.  I will inquire of TTS whether there has been any update this morning but at this point I think the patient probably needs to be referred to social work to see whether placement is an option or what the next step would be.  If commitment is in place I will make sure it is discontinued before contacting TOC.  No change to medicine today.  Husband updated about plan.  Disposition:  See note above  , MD 03/02/2021 12:44 PM

## 2021-03-02 NOTE — ED Notes (Addendum)
Pt continues to be resting quietly in bed. Pt refuses blood pressure at this time. Sitter remains in room at this time.

## 2021-03-03 DIAGNOSIS — F0391 Unspecified dementia with behavioral disturbance: Secondary | ICD-10-CM | POA: Diagnosis not present

## 2021-03-03 NOTE — TOC Transition Note (Signed)
Transition of Care [facility]) - CM/SW Discharge Note   Patient Details  Name: Tina Jackson MRN: [id number] Date of Birth: Dec 15, 1945  [facility]) CM/SW Contact:  Marina Goodell Phone Number: [phone] 03/03/2021, 12:46 PM   Clinical Narrative:     Patient will discharge home with Tina Jackson, Tina Jackson (Spouse) [phone] [facility]).  Mr Buikema will arrive around 3:00PM to pick up the patient.  EDP/ED Staff updated.     Barriers to Discharge: No Barriers Identified   Patient Goals and CMS Choice        Discharge Placement                       Discharge Plan and Services                                     Social Determinants of Health (SDOH) Interventions     Readmission Risk Interventions No flowsheet data found.

## 2021-03-03 NOTE — ED Notes (Signed)
VOL/Pending Placement 

## 2021-03-03 NOTE — ED Notes (Signed)
Pt is eating lunch  °

## 2021-03-03 NOTE — ED Notes (Signed)
Spouse provided with AVS and verbalizes understanding of discharge instructions.

## 2021-03-03 NOTE — ED Notes (Signed)
Report received from Elana, RN. Pt resting quietly in bed with eyes closed. Bed in lowest position and locked. No S/S of distress noted. Spouse and sitter in room with pt.

## 2021-03-03 NOTE — ED Notes (Signed)
Pt's husband to bedside.

## 2021-03-03 NOTE — ED Notes (Signed)
Pt out of room and walking down hallway. Pt able to be redirected and taken back into room. Pt breakfast tray set up by this RN. Pt is currently eating breakfast.

## 2021-03-03 NOTE — ED Notes (Signed)
Pt getting dressed. Spouse is here to take pt home. MD aware.

## 2021-03-03 NOTE — TOC Initial Note (Signed)
Transition of Care [facility]) - Initial/Assessment Note    Patient Details  Name: Tina Jackson MRN: [id number] Date of Birth: 12-23-1945  Transition of Care [facility]) CM/SW Contact:    Tina Jackson Phone Number: [phone] 03/03/2021, 12:28 PM  Clinical Narrative:                  Patient presents to [facility] with Pizzimenti, Tina Jackson (Spouse) [phone] (Mobile) due to altered mental status, agitation and wandering.  Patient is dx with Dementia of Alzheimer's type with behavioral disturbance (HCC).  Patient lives at home with her spouse Tina Jackson. CSW spoke with Tina Jackson and he stated they do not have familial support.  Tina Jackson stated he is unable to afford the fees for private pay memory Care facility which has been estimated between 9,500-12,500 per month.  CSW and Tina Jackson went over the different options including, applying for Medicaid and private care personal assistants. CSW and Tina Jackson also spoke about respite care, mental health providers and support groups and groups for family members. Tina Jackson verbalized understanding.     Barriers to Discharge: No Barriers Identified   Patient Goals and CMS Choice        Expected Discharge Plan and Services                                                Prior Living Arrangements/Services                       Activities of Daily Living      Permission Sought/Granted                  Emotional Assessment              Admission diagnosis:  agressive, off meds  Patient Active Problem List   Diagnosis Date Noted   Dementia of Alzheimer's type with behavioral disturbance (HCC) 02/22/2021   Severe recurrent major depression without psychotic features (HCC) 02/22/2021   PCP:  Tina Penton, MD Pharmacy:   [facility] [id number] [place], [place] - [address] [place] [address] Phone: [phone] Fax: [phone]     Social Determinants of Health (SDOH)  Interventions    Readmission Risk Interventions No flowsheet data found.

## 2021-03-03 NOTE — ED Provider Notes (Signed)
Procedures     ----------------------------------------- 2:20 PM on 03/03/2021 -----------------------------------------   Social worker has arranged with patient's spouse to come pick her up and take her home due to prolonged stay in the ED and inability to be effectively placed from here.  She is being referred to [facility] and outpatient case management.  She remains medically stable   Sharman Cheek, MD 03/03/21 1421

## 2021-05-25 ENCOUNTER — Other Ambulatory Visit: Payer: PPO

## 2021-05-25 ENCOUNTER — Emergency Department: Payer: PPO

## 2021-05-25 ENCOUNTER — Other Ambulatory Visit: Payer: Self-pay

## 2021-05-25 ENCOUNTER — Observation Stay
Admission: EM | Admit: 2021-05-25 | Discharge: 2021-05-26 | Disposition: A | Discharge status: Home / Self Care | Payer: PPO | Attending: Hospitalist | Admitting: Hospitalist

## 2021-05-25 ENCOUNTER — Observation Stay: Payer: PPO

## 2021-05-25 DIAGNOSIS — I739 Peripheral vascular disease, unspecified: Secondary | ICD-10-CM | POA: Diagnosis not present

## 2021-05-25 DIAGNOSIS — Z20822 Contact with and (suspected) exposure to covid-19: Secondary | ICD-10-CM | POA: Insufficient documentation

## 2021-05-25 DIAGNOSIS — G9389 Other specified disorders of brain: Secondary | ICD-10-CM | POA: Diagnosis not present

## 2021-05-25 DIAGNOSIS — R55 Syncope and collapse: Secondary | ICD-10-CM | POA: Diagnosis present

## 2021-05-25 DIAGNOSIS — R404 Transient alteration of awareness: Secondary | ICD-10-CM | POA: Diagnosis not present

## 2021-05-25 DIAGNOSIS — F02818 Dementia in other diseases classified elsewhere, unspecified severity, with other behavioral disturbance: Secondary | ICD-10-CM | POA: Diagnosis present

## 2021-05-25 DIAGNOSIS — M4692 Unspecified inflammatory spondylopathy, cervical region: Secondary | ICD-10-CM | POA: Diagnosis not present

## 2021-05-25 DIAGNOSIS — M4312 Spondylolisthesis, cervical region: Secondary | ICD-10-CM | POA: Diagnosis not present

## 2021-05-25 DIAGNOSIS — R4020 Unspecified coma: Secondary | ICD-10-CM

## 2021-05-25 DIAGNOSIS — F32A Depression, unspecified: Secondary | ICD-10-CM | POA: Diagnosis present

## 2021-05-25 DIAGNOSIS — S199XXA Unspecified injury of neck, initial encounter: Secondary | ICD-10-CM | POA: Diagnosis not present

## 2021-05-25 DIAGNOSIS — M47812 Spondylosis without myelopathy or radiculopathy, cervical region: Secondary | ICD-10-CM | POA: Diagnosis not present

## 2021-05-25 DIAGNOSIS — G309 Alzheimer's disease, unspecified: Secondary | ICD-10-CM | POA: Insufficient documentation

## 2021-05-25 DIAGNOSIS — R52 Pain, unspecified: Secondary | ICD-10-CM | POA: Diagnosis not present

## 2021-05-25 DIAGNOSIS — R569 Unspecified convulsions: Principal | ICD-10-CM

## 2021-05-25 DIAGNOSIS — Z79899 Other long term (current) drug therapy: Secondary | ICD-10-CM | POA: Insufficient documentation

## 2021-05-25 HISTORY — DX: Unspecified dementia, unspecified severity, without behavioral disturbance, psychotic disturbance, mood disturbance, and anxiety: F03.90

## 2021-05-25 HISTORY — DX: Depression, unspecified: F32.A

## 2021-05-25 LAB — CBC WITH DIFFERENTIAL/PLATELET
Abs Immature Granulocytes: 0.02 10*3/uL (ref 0.00–0.07)
Basophils Absolute: 0 10*3/uL (ref 0.0–0.1)
Basophils Relative: 1 %
Eosinophils Absolute: 0.2 10*3/uL (ref 0.0–0.5)
Eosinophils Relative: 5 %
HCT: 36.1 % (ref 36.0–46.0)
Hemoglobin: 12.5 g/dL (ref 12.0–15.0)
Immature Granulocytes: 1 %
Lymphocytes Relative: 35 %
Lymphs Abs: 1.5 10*3/uL (ref 0.7–4.0)
MCH: 31.6 pg (ref 26.0–34.0)
MCHC: 34.6 g/dL (ref 30.0–36.0)
MCV: 91.4 fL (ref 80.0–100.0)
Monocytes Absolute: 0.4 10*3/uL (ref 0.1–1.0)
Monocytes Relative: 10 %
Neutro Abs: 2.1 10*3/uL (ref 1.7–7.7)
Neutrophils Relative %: 48 %
Platelets: 134 10*3/uL — ABNORMAL LOW (ref 150–400)
RBC: 3.95 MIL/uL (ref 3.87–5.11)
RDW: 14.4 % (ref 11.5–15.5)
WBC: 4.2 10*3/uL (ref 4.0–10.5)
nRBC: 0 % (ref 0.0–0.2)

## 2021-05-25 LAB — COMPREHENSIVE METABOLIC PANEL
ALT: 13 U/L (ref 0–44)
AST: 18 U/L (ref 15–41)
Albumin: 3.7 g/dL (ref 3.5–5.0)
Alkaline Phosphatase: 60 U/L (ref 38–126)
Anion gap: 12 (ref 5–15)
BUN: 21 mg/dL (ref 8–23)
CO2: 24 mmol/L (ref 22–32)
Calcium: 8.9 mg/dL (ref 8.9–10.3)
Chloride: 105 mmol/L (ref 98–111)
Creatinine, Ser: 0.72 mg/dL (ref 0.44–1.00)
GFR, Estimated: >60 mL/min (ref >60)
Glucose, Bld: 125 mg/dL — ABNORMAL HIGH (ref 70–99)
Potassium: 3.8 mmol/L (ref 3.5–5.1)
Sodium: 141 mmol/L (ref 135–145)
Total Bilirubin: 0.9 mg/dL (ref 0.3–1.2)
Total Protein: 6.3 g/dL — ABNORMAL LOW (ref 6.5–8.1)

## 2021-05-25 LAB — RESP PANEL BY RT-PCR (FLU A&B, COVID) ARPGX2
Influenza A by PCR: NEGATIVE
Influenza B by PCR: NEGATIVE
SARS Coronavirus 2 by RT PCR: NEGATIVE

## 2021-05-25 LAB — TROPONIN I (HIGH SENSITIVITY)
Troponin I (High Sensitivity): <2 ng/L (ref <18)
Troponin I (High Sensitivity): <2 ng/L (ref <18)

## 2021-05-25 LAB — MAGNESIUM: Magnesium: 2 mg/dL (ref 1.7–2.4)

## 2021-05-25 MED ORDER — LORAZEPAM 1 MG PO TABS
1.0000 mg | ORAL_TABLET | Freq: Two times a day (BID) | ORAL | Status: DC
  Administered 2021-05-25: 1 mg via ORAL
  Filled 2021-05-25 (×2): qty 1

## 2021-05-25 MED ORDER — DIVALPROEX SODIUM 125 MG PO CSDR
500.0000 mg | DELAYED_RELEASE_CAPSULE | Freq: Two times a day (BID) | ORAL | Status: DC
  Administered 2021-05-25 – 2021-05-26 (×2): 500 mg via ORAL
  Filled 2021-05-25 (×3): qty 4

## 2021-05-25 MED ORDER — ACETAMINOPHEN 325 MG PO TABS: 650.0000 mg | ORAL_TABLET | Freq: Four times a day (QID) | ORAL | Status: DC | PRN

## 2021-05-25 MED ORDER — OLANZAPINE 10 MG PO TBDP
10.0000 mg | ORAL_TABLET | Freq: Two times a day (BID) | ORAL | Status: DC
  Administered 2021-05-26: 10 mg via ORAL
  Filled 2021-05-25 (×3): qty 1
  Filled 2021-05-25: qty 2

## 2021-05-25 MED ORDER — ACETAMINOPHEN 650 MG RE SUPP
650.0000 mg | Freq: Four times a day (QID) | RECTAL | Status: DC | PRN
  Filled 2021-05-25: qty 1

## 2021-05-25 MED ORDER — FLUOXETINE HCL 20 MG/5ML PO SOLN
20.0000 mg | Freq: Every day | ORAL | Status: DC
  Administered 2021-05-26: 20 mg via ORAL
  Filled 2021-05-25 (×2): qty 5

## 2021-05-25 MED ORDER — ONDANSETRON HCL 4 MG/2ML IJ SOLN: 4.0000 mg | Freq: Four times a day (QID) | INTRAMUSCULAR | Status: DC | PRN

## 2021-05-25 MED ORDER — ONDANSETRON HCL 4 MG PO TABS: 4.0000 mg | ORAL_TABLET | Freq: Four times a day (QID) | ORAL | Status: DC | PRN

## 2021-05-25 NOTE — Consult Note (Signed)
Neurology Consultation Reason for Consult: Concern for seizure Referring Physician: Agbata, T  CC: Concern for seizure  History is obtained from: Patient's husband  HPI: Tina Jackson is a 75 y.o. female with a history of dementia, who does not speak much at baseline was in her normal state of health this morning.  She was sitting at the counter and the husband was turned away from her.  He heard her vocalize, and turned around and she had her arms outstretched and stiff then she subsequently fell.  She never had clear convulsive activity, but she was quite altered after the fall.  He states she never really came around.  He also notes that her breathing was quite abnormal and when he imitates that, it closely resembles a postictal breathing pattern.  Since that time, she has improved and is essentially back to her normal impaired baseline at this time.  At baseline, she needs help getting dressed, but can sometimes do some of her buttons.  She does not speak much, and her speech is mostly unintelligible.  She is able to walk, and is able to feed herself with her fingers, but she does not use silverware any longer.   ROS: Unable to obtain due to altered mental status.   Past Medical History:  Diagnosis Date   Dementia (HCC)    Depression      History reviewed. No pertinent family history.   Social History:  reports that she has never smoked. She has never used smokeless tobacco. She reports that she does not currently use alcohol. She reports that she does not use drugs.   Exam: Current vital signs: BP 112/67 (BP Location: Right Arm)   Pulse 74   Temp 97.8 F (36.6 C) (Axillary)   Resp 16   Ht 5\' 4"  (1.626 m)   Wt 46 kg   SpO2 97%   BMI 17.41 kg/m  Vital signs in last 24 hours: Temp:  [97.7 F (36.5 C)-97.8 F (36.6 C)] 97.8 F (36.6 C) (10/13 1532) Pulse Rate:  [71-75] 74 (10/13 1532) Resp:  [16-18] 16 (10/13 1532) BP: (109-112)/(65-67) 112/67 (10/13 1532) SpO2:  [92  %-98 %] 97 % (10/13 1532) Weight:  [46 kg] 46 kg (10/13 0827)   Physical Exam  Constitutional: Appears well-developed and well-nourished.  Psych: Affect appropriate to situation Eyes: No scleral injection HENT: No OP obstruction MSK: no joint deformities.  Cardiovascular: Normal rate and regular rhythm.  Respiratory: Effort normal, non-labored breathing GI: Soft.  No distension. There is no tenderness.  Skin: WDI  Neuro: Mental Status: Patient is awake, alert, she follows simple command to wiggle toes, but is not very cooperative thereafter.  She mumbles, but I am not able to understand anything she is saying Cranial Nerves: II: She fixates and tracks, but does not clearly blink to threat from either direction. Pupils are equal, round, and reactive to light.   III,IV, VI: EOMI without ptosis or diploplia.  V: Facial sensation is symmetric to temperature VII: Facial movement is symmetric.  VIII: hearing is intact to voice X: Uvula elevates symmetrically XI: Shoulder shrug is symmetric. XII: tongue is midline without atrophy or fasciculations.  Motor: Tone is normal. Bulk is normal. 5/5 strength was present in all four extremities.  Sensory: She responds to stimulation bilaterally DTR: 2+ and symmetric at the patella Cerebellar: She does not perform     I have reviewed labs in epic and the results pertinent to this consultation are: Sodium 141 Glucose 125 Creatinine  0.7 LFTs-normal CBC-unremarkable  I have reviewed the images obtained: CT head-atrophy  Impression: 74 year old female with advanced dementia who presents with an episode that very much is consistent with a new onset seizure.  I would favor starting an antiepileptic in this case, and in her case would favor using Depakote.  Recommendations: 1) Depakote 500 mg twice daily 2) EEG 3) if she is able without too much sedation to get an MRI, this could be reasonable but I would not pursue it beyond light  sedation 4) neurology will continue to follow   Ritta Slot, MD [facility] [phone]  If 7pm- 7am, please page neurology on call as listed in AMION.

## 2021-05-25 NOTE — ED Provider Notes (Signed)
[facility] Emergency Department Provider Note   ____________________________________________   Event Date/Time   First MD Initiated Contact with Patient 05/25/21 [phone]     (approximate)  I have reviewed the triage vital signs and the nursing notes.   HISTORY  Chief Complaint Seizures    HPI Tina Jackson is a 75 y.o. female with past medical history of Alzheimer's dementia and depression who presents to the ED following possible seizure.  Per EMS, patient was sitting at a barstool having her breakfast when husband noticed her stiffen and fall backwards.  She fell off the stool and struck her head, appeared to lose consciousness.  EMS reports that patient was disoriented upon their arrival and they were concerned she could be postictal.  There is no report of shaking activity and patient did not urinate on herself.  At her baseline, she is reportedly ambulatory and talkative.  She is now awake and alert, but is not answering questions.        History reviewed. No pertinent past medical history.  Patient Active Problem List   Diagnosis Date Noted   Syncope and collapse 05/25/2021   Dementia of Alzheimer's type with behavioral disturbance (HCC) 02/22/2021   Severe recurrent major depression without psychotic features (HCC) 02/22/2021    Past Surgical History:  Procedure Laterality Date   ABDOMINAL HYSTERECTOMY      Prior to Admission medications   Medication Sig Start Date End Date Taking? Authorizing Provider  FLUoxetine (PROZAC) 20 MG/5ML solution Take 20 mg by mouth daily. 02/02/21 02/02/22 Yes [provider]  LORazepam (ATIVAN) 1 MG tablet Take 1 mg by mouth 2 (two) times daily. 01/15/21  Yes [provider]  OLANZapine zydis (ZYPREXA) 5 MG disintegrating tablet Take 1 tablet (5 mg total) by mouth 2 (two) times daily. Patient taking differently: Take 10 mg by mouth 2 (two) times daily. 02/22/21  Yes Clapacs, Jackquline Denmark, MD     Allergies Patient has no known allergies.  History reviewed. No pertinent family history.  Social History Social History   Tobacco Use   Smoking status: Never   Smokeless tobacco: Never  Substance Use Topics   Alcohol use: Not Currently   Drug use: Never    Review of Systems Unable to obtain secondary to altered mental status and dementia. ____________________________________________   PHYSICAL EXAM:  VITAL SIGNS: ED Triage Vitals  Enc Vitals Group     BP      Pulse      Resp      Temp      Temp src      SpO2      Weight      Height      Head Circumference      Peak Flow      Pain Score      Pain Loc      Pain Edu?      Excl. in GC?     Constitutional: Awake and alert, nonverbal. Eyes: Conjunctivae are normal.  Pupils equal, round, and reactive to light bilaterally. Head: Atraumatic. Nose: No congestion/rhinnorhea. Mouth/Throat: Mucous membranes are moist. Neck: Normal ROM, no midline cervical spine tenderness to palpation. Cardiovascular: Normal rate, regular rhythm. Grossly normal heart sounds.  2+ radial pulses bilaterally. Respiratory: Normal respiratory effort.  No retractions. Lungs CTAB. Gastrointestinal: Soft and nontender. No distention. Genitourinary: deferred Musculoskeletal: No lower extremity tenderness nor edema.  No upper extremity bony tenderness to palpation. Neurologic: Unable to assess language, patient currently nonverbal.  Patient not currently following commands, moving all extremities spontaneously. Skin:  Skin is warm, dry and intact. No rash noted. Psychiatric: Unable to assess.  ____________________________________________   LABS (all labs ordered are listed, but only abnormal results are displayed)  Labs Reviewed  CBC WITH DIFFERENTIAL/PLATELET - Abnormal; Notable for the following components:      Result Value   Platelets 134 (*)    All other components within normal limits  COMPREHENSIVE METABOLIC PANEL - Abnormal;  Notable for the following components:   Glucose, Bld 125 (*)    Total Protein 6.3 (*)    All other components within normal limits  RESP PANEL BY RT-PCR (FLU A&B, COVID) ARPGX2  URINALYSIS, COMPLETE (UACMP) WITH MICROSCOPIC  TROPONIN I (HIGH SENSITIVITY)  TROPONIN I (HIGH SENSITIVITY)   ____________________________________________  EKG  ED ECG REPORT I, Chesley Noon, the attending physician, personally viewed and interpreted this ECG.   Date: 05/25/2021  EKG Time: 8:26  Rate: 74  Rhythm: normal sinus rhythm  Axis: RAD  Intervals:none  ST&T Change: None   PROCEDURES  Procedure(s) performed (including Critical Care):  Procedures   ____________________________________________   INITIAL IMPRESSION / ASSESSMENT AND PLAN / ED COURSE      75 year old female with past medical history of dementia and depression who presents to the ED following episode where she stiffened and fell backwards off of a barstool, striking her head.  Unclear if this episode represents syncope versus seizure, although seizure thought to be less likely given no shaking observed and patient did not bite her tongue or urinate on herself.  She is now awake and alert, but not back to her reported baseline.  We will further assess with CT head and cervical spine, no apparent focal neurologic deficits.  EKG shows no evidence of arrhythmia or ischemia, we will observe on cardiac monitor and check labs.  CT head and cervical spine are negative for acute process, chest x-ray reviewed by me and shows no infiltrate, edema, or effusion.  Labs are unremarkable and patient seems to have returned to her baseline mental status.  Family prefers to have patient admitted for observation for further assessment of syncope versus seizure.  Case discussed with hospitalist for admission.      ____________________________________________   FINAL CLINICAL IMPRESSION(S) / ED DIAGNOSES  Final diagnoses:  Seizure-like  activity (HCC)  Loss of consciousness [facility])     ED Discharge Orders     None        Note:  This document was prepared using Dragon voice recognition software and may include unintentional dictation errors.    Chesley Noon, MD 05/25/21 1050

## 2021-05-25 NOTE — H&P (Signed)
Possible History and Physical    Tina Jackson XBD:[id number] DOB: 1945/11/01 DOA: 05/25/2021  PCP: Danella Penton, MD   Patient coming from: Home  I have personally briefly reviewed patient's old medical records in [facility]  Chief Complaint: Mental status change  Most of the history was obtained from patient's daughter at the bedside.  She is unable to provide any history due to her underlying dementia  HPI: Tina Jackson is a 75 y.o. female with medical history significant for dementia and depression who presents to the ER via EMS for evaluation of possible seizure episode. Patient's daughter states that she was sitting on a barstool in the kitchen when she suddenly groaned and stiffened and then fell backwards hitting her head on the ground.  Her husband was in the kitchen and had his back to the patient when this happened. EMS was called and when they arrived they noted the patient was disoriented.  Her daughter states that it is unclear if she had any urinary or fecal incontinence or any tongue bite. At baseline she is usually ambulatory and talkative but during my exam she is awake and alert but not answering any questions. I am unable to do review of systems due to her history of dementia. Labs show sodium 141, potassium 3.8, chloride 105, bicarb 24, glucose 125, BUN 21, creatinine 0.72, calcium 8.9, alkaline phosphatase 60, albumin 3.7, AST 18, ALT 13, total protein 6.3, troponin 2, white count 4.2, hemoglobin 12.5, hematocrit 36.1, MCV 99.4, RDW 14.4, platelet count 134 Respiratory viral panel is negative CT scan of the head without contrast shows no acute intracranial pathology.  Unchanged global parenchymal volume loss and chronic white matter microangiopathy. Cervical spine CT shows no acute fracture or traumatic malalignment of the cervical spine. Multilevel spondylosis and spondylolisthesis as above, most advanced at C5-C6. Chest x-ray reviewed by me shows no evidence of  acute cardiopulmonary disease Twelve-lead EKG shows sinus rhythm with borderline right axis deviation.   ED Course: Patient is a 75 year old female who was brought into the ER by EMS after she had an episode at home where she fell off a barstool and stiffened on the ground concerning for new onset seizure. She will be referred to observation status for further evaluation  Review of Systems: As per HPI otherwise all other systems reviewed and negative.    Past Medical History:  Diagnosis Date   Dementia (HCC)    Depression     Past Surgical History:  Procedure Laterality Date   ABDOMINAL HYSTERECTOMY       reports that she has never smoked. She has never used smokeless tobacco. She reports that she does not currently use alcohol. She reports that she does not use drugs.  No Known Allergies  History reviewed. No pertinent family history.   Unable to obtain history due to dementia   Prior to Admission medications   Medication Sig Start Date End Date Taking? Authorizing Provider  FLUoxetine (PROZAC) 20 MG/5ML solution Take 20 mg by mouth daily. 02/02/21 02/02/22 Yes [provider]  LORazepam (ATIVAN) 1 MG tablet Take 1 mg by mouth 2 (two) times daily. 01/15/21  Yes [provider]  OLANZapine zydis (ZYPREXA) 5 MG disintegrating tablet Take 1 tablet (5 mg total) by mouth 2 (two) times daily. Patient taking differently: Take 10 mg by mouth 2 (two) times daily. 02/22/21  Yes Clapacs, Jackquline Denmark, MD    Physical Exam: Vitals:   05/25/21 8341 05/25/21 0827 05/25/21 0915  BP:  109/65    Pulse: 75  71  Resp: 18  16  Temp: 97.7 F (36.5 C)    TempSrc: Axillary    SpO2: 92%  98%  Weight:  46 kg   Height:  5\' 4"  (1.626 m)      Vitals:   05/25/21 0826 05/25/21 0827 05/25/21 0915  BP: 109/65    Pulse: 75  71  Resp: 18  16  Temp: 97.7 F (36.5 C)    TempSrc: Axillary    SpO2: 92%  98%  Weight:  46 kg   Height:  5\' 4"  (1.626 m)       Constitutional: Alert and  awake. Not in any apparent distress HEENT:      Head: Normocephalic and atraumatic.         Eyes: PERLA, EOMI, Conjunctivae are normal. Sclera is non-icteric.       Mouth/Throat: Mucous membranes are moist.       Neck: Supple with no signs of meningismus. Cardiovascular: Regular rate and rhythm. No murmurs, gallops, or rubs. 2+ symmetrical distal pulses are present . No JVD. No LE edema Respiratory: Respiratory effort normal .Lungs sounds clear bilaterally. No wheezes, crackles, or rhonchi.  Gastrointestinal: Soft, non tender, and non distended with positive bowel sounds.  Genitourinary: No CVA tenderness. Musculoskeletal: Nontender with normal range of motion in all extremities. No cyanosis, or erythema of extremities. Neurologic:  Face is symmetric. Moving all extremities. No gross focal neurologic deficits . Skin: Skin is warm, dry.  No rash or ulcers Psychiatric: Flat affect   Labs on Admission: I have personally reviewed following labs and imaging studies  CBC: Recent Labs  Lab 05/25/21 0825  WBC 4.2  NEUTROABS 2.1  HGB 12.5  HCT 36.1  MCV 91.4  PLT 134*   Basic Metabolic Panel: Recent Labs  Lab 05/25/21 0825  NA 141  K 3.8  CL 105  CO2 24  GLUCOSE 125*  BUN 21  CREATININE 0.72  CALCIUM 8.9   GFR: Estimated Creatinine Clearance: 44.1 mL/min (by C-G formula based on SCr of 0.72 mg/dL). Liver Function Tests: Recent Labs  Lab 05/25/21 0825  AST 18  ALT 13  ALKPHOS 60  BILITOT 0.9  PROT 6.3*  ALBUMIN 3.7   No results for input(s): LIPASE, AMYLASE in the last 168 hours. No results for input(s): AMMONIA in the last 168 hours. Coagulation Profile: No results for input(s): INR, PROTIME in the last 168 hours. Cardiac Enzymes: No results for input(s): CKTOTAL, CKMB, CKMBINDEX, TROPONINI in the last 168 hours. BNP (last 3 results) No results for input(s): PROBNP in the last 8760 hours. HbA1C: No results for input(s): HGBA1C in the last 72 hours. CBG: No  results for input(s): GLUCAP in the last 168 hours. Lipid Profile: No results for input(s): CHOL, HDL, LDLCALC, TRIG, CHOLHDL, LDLDIRECT in the last 72 hours. Thyroid Function Tests: No results for input(s): TSH, T4TOTAL, FREET4, T3FREE, THYROIDAB in the last 72 hours. Anemia Panel: No results for input(s): VITAMINB12, FOLATE, FERRITIN, TIBC, IRON, RETICCTPCT in the last 72 hours. Urine analysis:    Component Value Date/Time   COLORURINE YELLOW (A) 01/17/2020 1827   APPEARANCEUR HAZY (A) 01/17/2020 1827   LABSPEC 1.017 01/17/2020 1827   PHURINE 5.0 01/17/2020 1827   GLUCOSEU NEGATIVE 01/17/2020 1827   HGBUR NEGATIVE 01/17/2020 1827   BILIRUBINUR NEGATIVE 01/17/2020 1827   KETONESUR NEGATIVE 01/17/2020 1827   PROTEINUR NEGATIVE 01/17/2020 1827   NITRITE NEGATIVE 01/17/2020 1827   LEUKOCYTESUR NEGATIVE 01/17/2020 1827  Radiological Exams on Admission: CT Head Wo Contrast  Result Date: 05/25/2021 CLINICAL DATA:  Syncope, possible seizure EXAM: CT HEAD WITHOUT CONTRAST TECHNIQUE: Contiguous axial images were obtained from the base of the skull through the vertex without intravenous contrast. COMPARISON:  Brain MRI 03/02/2019, CT head 01/10/2021 FINDINGS: Brain: There is no acute intracranial hemorrhage, extra-axial fluid collection, or acute infarct. There is unchanged mild-to-moderate global parenchymal volume loss with commensurate enlargement of the ventricular system. Foci of hypodensity in the subcortical and periventricular white matter are nonspecific but likely reflects sequela of chronic white matter microangiopathy, unchanged. There is no mass lesion.  There is no midline shift. Vascular: No hyperdense vessel or unexpected calcification. Skull: Normal. Negative for fracture or focal lesion. Sinuses/Orbits: The imaged paranasal sinuses are clear. The globes and orbits are unremarkable. Other: None. IMPRESSION: 1. No acute intracranial pathology. 2. Unchanged global parenchymal  volume loss and chronic white matter microangiopathy. Electronically Signed   By: Lesia Hausen M.D.   On: 05/25/2021 09:16   CT Cervical Spine Wo Contrast  Result Date: 05/25/2021 CLINICAL DATA:  Neck trauma, fall EXAM: CT CERVICAL SPINE WITHOUT CONTRAST TECHNIQUE: Multidetector CT imaging of the cervical spine was performed without intravenous contrast. Multiplanar CT image reconstructions were also generated. COMPARISON:  None. FINDINGS: Alignment: There is straightening of the normal cervical spine lordosis. There is grade 1 anterolisthesis of C3 on C4 and C4 on C5, and grade 1 retrolisthesis of C5 on C6. There is no jumped or perched facet or other evidence of traumatic malalignment. Skull base and vertebrae: Skull base alignment is maintained. Vertebral body heights are preserved. There is no evidence of acute fracture. Soft tissues and spinal canal: No prevertebral fluid or swelling. No visible canal hematoma. Disc levels: There is intervertebral disc space narrowing with associated uncovertebral arthropathy most advanced at C5-C6 and C6-C7. There is multilevel facet arthropathy, most advanced on the left at C3-C4 and on the right at C4-C5. There is severe right neural foraminal stenosis at C5-C6 and C6-C7. The osseous spinal canal is patent. Upper chest: The lung apices are clear. Other: None. IMPRESSION: 1. No acute fracture or traumatic malalignment of the cervical spine. 2. Multilevel spondylosis and spondylolisthesis as above, most advanced at C5-C6. Electronically Signed   By: Lesia Hausen M.D.   On: 05/25/2021 09:20   DG Chest Portable 1 View  Result Date: 05/25/2021 CLINICAL DATA:  Syncope possible seizure EXAM: PORTABLE CHEST 1 VIEW COMPARISON:  None. FINDINGS: The heart is at the upper limits of normal for size. The mediastinal contours are normal. There is no focal consolidation or pulmonary edema. There is no pleural effusion or pneumothorax. There is no acute osseous abnormality.  IMPRESSION: No radiographic evidence of acute cardiopulmonary process. Electronically Signed   By: Lesia Hausen M.D.   On: 05/25/2021 09:12     Assessment/Plan Principal Problem:   Seizure [facility]) Active Problems:   Dementia of Alzheimer's type with behavioral disturbance (HCC)   Syncope and collapse     Patient is a 75 year old female with a history of dementia and depression who presents to the ER for evaluation of possible new onset seizure.    Probable new onset seizure Patient was brought into the ER for evaluation after she had an episode where she fell and stiffened up on the ground and has been disoriented since the episode concerning for postictal state. There was no urinary or fecal incontinence and no evidence of tongue bite Patient does not have a known history  of seizure disorder Obtain EEG Obtain MRI of the brain Consult neurology    Rule out possible syncope Orthostatic blood pressure checks Obtain 2D echocardiogram to assess LVEF Place patient on a cardiac monitor to rule out arrhythmias     History of depression/dementia Continue fluoxetine, lorazepam and Zyprexa  DVT prophylaxis: SCD  Code Status: full code  Family Communication: Called and left a voicemail for patient's husband, Ron Mcgivern.  Awaiting callback Disposition Plan: Back to previous home environment Consults called: Neurology Status: Observation    Olamide Lahaie MD [facility]     05/25/2021, 12:30 PM

## 2021-05-25 NOTE — ED Triage Notes (Signed)
Pt comes into the ED via [facility] from home c/o possible seizure.  Pt was sitting on a bar stool when this happened and she fell back and hit her head.  Denies any blood thinners.  H/o alzheimers.  Pt was postictal with EMS, but is slowly returning back to baseline.

## 2021-05-25 NOTE — Procedures (Signed)
Pt's family expressed desire for eeg to be done tomorrow instead of this afternoon.

## 2021-05-26 ENCOUNTER — Observation Stay: Payer: PPO

## 2021-05-26 ENCOUNTER — Observation Stay: Admit: 2021-05-26 | Payer: PPO

## 2021-05-26 DIAGNOSIS — R569 Unspecified convulsions: Secondary | ICD-10-CM | POA: Diagnosis not present

## 2021-05-26 LAB — BASIC METABOLIC PANEL
Anion gap: 5 (ref 5–15)
BUN: 18 mg/dL (ref 8–23)
CO2: 28 mmol/L (ref 22–32)
Calcium: 8.5 mg/dL — ABNORMAL LOW (ref 8.9–10.3)
Chloride: 108 mmol/L (ref 98–111)
Creatinine, Ser: 0.56 mg/dL (ref 0.44–1.00)
GFR, Estimated: >60 mL/min (ref >60)
Glucose, Bld: 88 mg/dL (ref 70–99)
Potassium: 3.6 mmol/L (ref 3.5–5.1)
Sodium: 141 mmol/L (ref 135–145)

## 2021-05-26 LAB — CBC
HCT: 33.8 % — ABNORMAL LOW (ref 36.0–46.0)
Hemoglobin: 11.5 g/dL — ABNORMAL LOW (ref 12.0–15.0)
MCH: 31.6 pg (ref 26.0–34.0)
MCHC: 34 g/dL (ref 30.0–36.0)
MCV: 92.9 fL (ref 80.0–100.0)
Platelets: 136 10*3/uL — ABNORMAL LOW (ref 150–400)
RBC: 3.64 MIL/uL — ABNORMAL LOW (ref 3.87–5.11)
RDW: 14.5 % (ref 11.5–15.5)
WBC: 3.5 10*3/uL — ABNORMAL LOW (ref 4.0–10.5)
nRBC: 0 % (ref 0.0–0.2)

## 2021-05-26 MED ORDER — DIVALPROEX SODIUM 500 MG PO DR TAB: 500.0000 mg | DELAYED_RELEASE_TABLET | Freq: Two times a day (BID) | ORAL | 0 refills | Status: AC

## 2021-05-26 MED ORDER — OLANZAPINE 5 MG PO TBDP: 10.0000 mg | ORAL_TABLET | Freq: Two times a day (BID) | ORAL | Status: AC

## 2021-05-26 NOTE — Progress Notes (Addendum)
Subjective: Back to baseline per husband and daughter.   Exam: Vitals:   05/25/21 1532 05/25/21 2047  Resp: 16 18   Gen: In bed, NAD Resp: non-labored breathing, no acute distress Abd: soft, nt  Neuro: MS: awake, alert, mumbles VW:[id number] and tracks, but doe snot blink to threat.  Motor: up and walking, ? Motor apraxia of the RUE(when offering to shake hands, she eventually tries to use her left hand  Pertinent Labs: BMP unremarkable  Impression: 75 yo F with advanced dementia and episode most consistent with new onset seizure. She seems ot be tolerating depakote well and therefore I would continue this. I don't think EEG will change my managemetn, and with return to baseline, do not feel that aggressive sedation to obtain MRI is warrented.   Recommendations: 1) Depakote sprinkle 500mg  BID 2) D/C EEG 3) She will need outpatient neurology follow up. No further inpatient recommendations at this time.   , MD [facility] [phone]  If 7pm- 7am, please page neurology on call as listed in [facility].

## 2021-05-26 NOTE — Progress Notes (Signed)
Patient refused vital signs and MRI, MD aware.

## 2021-05-26 NOTE — Care Management Obs Status (Addendum)
MEDICARE OBSERVATION STATUS NOTIFICATION   Patient Details  Name: Tanaysha Alkins MRN: [id number] Date of Birth: Dec 02, 1945   Medicare Observation Status Notification Given:  Yes  Patient disoriented x 4. Reviewed with husband.  Margarito Liner, LCSW 05/26/2021, 12:33 PM

## 2021-05-26 NOTE — TOC CM/SW Note (Signed)
Patient has orders to discharge home today. Chart reviewed. PCP is Bethann Punches, MD. On room air. No discharge wound care needs. No TOC needs identified. CSW signing off.  Charlynn Court, CSW [phone]

## 2021-05-26 NOTE — Discharge Summary (Addendum)
Physician Discharge Summary   Tina Jackson  female DOB: 1946-01-16  WYO:[id number]  PCP: Danella Penton, MD  Admit date: 05/25/2021 Discharge date: 05/26/2021  Admitted From: home Disposition:  home CODE STATUS: Full code  Discharge Instructions     Ambulatory referral to Neurology   Complete by: As directed    seizure   Discharge instructions   Complete by: As directed    Neurologist has started you on seizure medication, Depakote 500 mg twice a day, and cleared you to go home.    Please follow up with outpatient neurology.   Dr. Darlin Priestly [facility] Course:  For full details, please see H&P, progress notes, consult notes and ancillary notes.  Briefly,  Tina Jackson is a 75 y.o. female with medical history significant for dementia and depression who presented to the ER via EMS for evaluation of possible seizure episode.  Patient's daughter stated that she was sitting on a barstool in the kitchen when she suddenly groaned and stiffened and then fell backwards hitting her head on the ground.  Her husband was in the kitchen and had his back to the patient when this happened.  EMS was called and when they arrived they noted the patient was disoriented.   Probable new onset seizure Patient does not have a known history of seizure disorder --Neuro was consulted and started pt on Depakote sprinkle 500mg  BID.   --Pt could not tolerate MRI brain, and EEG was not done since it wouldn't change management.   --Pt was back to baseline prior to discharge, per family. --pt will follow up with outpatient neurology.  Referral made.    History of depression/dementia Continue fluoxetine, lorazepam and Zyprexa   Discharge Diagnoses:  Principal Problem:   Seizure (HCC) Active Problems:   Dementia of Alzheimer's type with behavioral disturbance (HCC)   Syncope and collapse   Depression     Discharge Instructions:  Allergies as of 05/26/2021   No Known Allergies       Medication List     TAKE these medications    divalproex 500 MG DR tablet Commonly known as: DEPAKOTE Take 1 tablet (500 mg total) by mouth 2 (two) times daily.  Addendum: Husband called back and said that pt had to have the Sprinkle form in order to take it mixed in with apple sauce.  Pt's home pharm 05/28/2021 was able to find Depakote Sprinkle from [place].     FLUoxetine 20 MG/5ML solution Commonly known as: PROZAC Take 20 mg by mouth daily.   LORazepam 1 MG tablet Commonly known as: ATIVAN Take 1 mg by mouth 2 (two) times daily.   OLANZapine zydis 5 MG disintegrating tablet Commonly known as: ZYPREXA Take 2 tablets (10 mg total) by mouth 2 (two) times daily. Home med What changed:  how much to take additional instructions         Follow-up Information     [place], MD Follow up in 1 week(s).   Specialty: Internal Medicine Contact information: [phone] [facility] [address] [facility] [place] [phone]         [phone], MD Follow up in 2 month(s).   Specialty: Neurology Contact information: [address] [facility] [place] [phone]                 No Known Allergies   The results of significant diagnostics from  this hospitalization (including imaging, microbiology, ancillary and laboratory) are listed below for reference.   Consultations:   Procedures/Studies: CT Head Wo Contrast  Result Date: 05/25/2021 CLINICAL DATA:  Syncope, possible seizure EXAM: CT HEAD WITHOUT CONTRAST TECHNIQUE: Contiguous axial images were obtained from the base of the skull through the vertex without intravenous contrast. COMPARISON:  Brain MRI 03/02/2019, CT head 01/10/2021 FINDINGS: Brain: There is no acute intracranial hemorrhage, extra-axial fluid collection, or acute infarct. There is unchanged mild-to-moderate global parenchymal volume loss with commensurate  enlargement of the ventricular system. Foci of hypodensity in the subcortical and periventricular white matter are nonspecific but likely reflects sequela of chronic white matter microangiopathy, unchanged. There is no mass lesion.  There is no midline shift. Vascular: No hyperdense vessel or unexpected calcification. Skull: Normal. Negative for fracture or focal lesion. Sinuses/Orbits: The imaged paranasal sinuses are clear. The globes and orbits are unremarkable. Other: None. IMPRESSION: 1. No acute intracranial pathology. 2. Unchanged global parenchymal volume loss and chronic white matter microangiopathy. Electronically Signed   By: Lesia Hausen M.D.   On: 05/25/2021 09:16   CT Cervical Spine Wo Contrast  Result Date: 05/25/2021 CLINICAL DATA:  Neck trauma, fall EXAM: CT CERVICAL SPINE WITHOUT CONTRAST TECHNIQUE: Multidetector CT imaging of the cervical spine was performed without intravenous contrast. Multiplanar CT image reconstructions were also generated. COMPARISON:  None. FINDINGS: Alignment: There is straightening of the normal cervical spine lordosis. There is grade 1 anterolisthesis of C3 on C4 and C4 on C5, and grade 1 retrolisthesis of C5 on C6. There is no jumped or perched facet or other evidence of traumatic malalignment. Skull base and vertebrae: Skull base alignment is maintained. Vertebral body heights are preserved. There is no evidence of acute fracture. Soft tissues and spinal canal: No prevertebral fluid or swelling. No visible canal hematoma. Disc levels: There is intervertebral disc space narrowing with associated uncovertebral arthropathy most advanced at C5-C6 and C6-C7. There is multilevel facet arthropathy, most advanced on the left at C3-C4 and on the right at C4-C5. There is severe right neural foraminal stenosis at C5-C6 and C6-C7. The osseous spinal canal is patent. Upper chest: The lung apices are clear. Other: None. IMPRESSION: 1. No acute fracture or traumatic malalignment  of the cervical spine. 2. Multilevel spondylosis and spondylolisthesis as above, most advanced at C5-C6. Electronically Signed   By: Lesia Hausen M.D.   On: 05/25/2021 09:20   DG Chest Portable 1 View  Result Date: 05/25/2021 CLINICAL DATA:  Syncope possible seizure EXAM: PORTABLE CHEST 1 VIEW COMPARISON:  None. FINDINGS: The heart is at the upper limits of normal for size. The mediastinal contours are normal. There is no focal consolidation or pulmonary edema. There is no pleural effusion or pneumothorax. There is no acute osseous abnormality. IMPRESSION: No radiographic evidence of acute cardiopulmonary process. Electronically Signed   By: Lesia Hausen M.D.   On: 05/25/2021 09:12      Labs: BNP (last 3 results) No results for input(s): BNP in the last 8760 hours. Basic Metabolic Panel: Recent Labs  Lab 05/25/21 0825 05/25/21 1010 05/26/21 0509  NA 141  --  141  K 3.8  --  3.6  CL 105  --  108  CO2 24  --  28  GLUCOSE 125*  --  88  BUN 21  --  18  CREATININE 0.72  --  0.56  CALCIUM 8.9  --  8.5*  MG  --  2.0  --    Liver  Function Tests: Recent Labs  Lab 05/25/21 0825  AST 18  ALT 13  ALKPHOS 60  BILITOT 0.9  PROT 6.3*  ALBUMIN 3.7   No results for input(s): LIPASE, AMYLASE in the last 168 hours. No results for input(s): AMMONIA in the last 168 hours. CBC: Recent Labs  Lab 05/25/21 0825 05/26/21 0509  WBC 4.2 3.5*  NEUTROABS 2.1  --   HGB 12.5 11.5*  HCT 36.1 33.8*  MCV 91.4 92.9  PLT 134* 136*   Cardiac Enzymes: No results for input(s): CKTOTAL, CKMB, CKMBINDEX, TROPONINI in the last 168 hours. BNP: Invalid input(s): POCBNP CBG: No results for input(s): GLUCAP in the last 168 hours. D-Dimer No results for input(s): DDIMER in the last 72 hours. Hgb A1c No results for input(s): HGBA1C in the last 72 hours. Lipid Profile No results for input(s): CHOL, HDL, LDLCALC, TRIG, CHOLHDL, LDLDIRECT in the last 72 hours. Thyroid function studies No results for  input(s): TSH, T4TOTAL, T3FREE, THYROIDAB in the last 72 hours.  Invalid input(s): FREET3 Anemia work up No results for input(s): VITAMINB12, FOLATE, FERRITIN, TIBC, IRON, RETICCTPCT in the last 72 hours. Urinalysis    Component Value Date/Time   COLORURINE YELLOW (A) 01/17/2020 1827   APPEARANCEUR HAZY (A) 01/17/2020 1827   LABSPEC 1.017 01/17/2020 1827   PHURINE 5.0 01/17/2020 1827   GLUCOSEU NEGATIVE 01/17/2020 1827   HGBUR NEGATIVE 01/17/2020 1827   BILIRUBINUR NEGATIVE 01/17/2020 1827   KETONESUR NEGATIVE 01/17/2020 1827   PROTEINUR NEGATIVE 01/17/2020 1827   NITRITE NEGATIVE 01/17/2020 1827   LEUKOCYTESUR NEGATIVE 01/17/2020 1827   Sepsis Labs Invalid input(s): PROCALCITONIN,  WBC,  LACTICIDVEN Microbiology Recent Results (from the past 240 hour(s))  Resp Panel by RT-PCR (Flu A&B, Covid) Nasopharyngeal Swab     Status: None   Collection Time: 05/25/21 10:10 AM   Specimen: Nasopharyngeal Swab; Nasopharyngeal(NP) swabs in vial transport medium  Result Value Ref Range Status   SARS Coronavirus 2 by RT PCR NEGATIVE NEGATIVE Final    Comment: (NOTE) SARS-CoV-2 target nucleic acids are NOT DETECTED.  The SARS-CoV-2 RNA is generally detectable in upper respiratory specimens during the acute phase of infection. The lowest concentration of SARS-CoV-2 viral copies this assay can detect is 138 copies/mL. A negative result does not preclude SARS-Cov-2 infection and should not be used as the sole basis for treatment or other patient management decisions. A negative result may occur with  improper specimen collection/handling, submission of specimen other than nasopharyngeal swab, presence of viral mutation(s) within the areas targeted by this assay, and inadequate number of viral copies(<138 copies/mL). A negative result must be combined with clinical observations, patient history, and epidemiological information. The expected result is Negative.  Fact Sheet for Patients:   BloggerCourse.com  Fact Sheet for Healthcare Providers:  SeriousBroker.it  This test is no t yet approved or cleared by the [place] FDA and  has been authorized for detection and/or diagnosis of SARS-CoV-2 by FDA under an Emergency Use Authorization (EUA). This EUA will remain  in effect (meaning this test can be used) for the duration of the COVID-19 declaration under Section 564(b)(1) of the Act, 21 U.S.C.section 360bbb-3(b)(1), unless the authorization is terminated  or revoked sooner.       Influenza A by PCR NEGATIVE NEGATIVE Final   Influenza B by PCR NEGATIVE NEGATIVE Final    Comment: (NOTE) The Xpert Xpress SARS-CoV-2/FLU/RSV plus assay is intended as an aid in the diagnosis of influenza from Nasopharyngeal swab specimens and should not be  used as a sole basis for treatment. Nasal washings and aspirates are unacceptable for Xpert Xpress SARS-CoV-2/FLU/RSV testing.  Fact Sheet for Patients: BloggerCourse.com  Fact Sheet for Healthcare Providers: SeriousBroker.it  This test is not yet approved or cleared by the [place] FDA and has been authorized for detection and/or diagnosis of SARS-CoV-2 by FDA under an Emergency Use Authorization (EUA). This EUA will remain in effect (meaning this test can be used) for the duration of the COVID-19 declaration under Section 564(b)(1) of the Act, 21 U.S.C. section 360bbb-3(b)(1), unless the authorization is terminated or revoked.  Performed at [facility], [address] [place], [place] [address]      Total time spend on discharging this patient, including the last patient exam, discussing the hospital stay, instructions for ongoing care as it relates to all pertinent caregivers, as well as preparing the medical discharge records, prescriptions, and/or referrals as applicable, is 60 minutes.    Darlin Priestly,  MD  [facility] 05/26/2021, 11:33 AM

## 2021-05-26 NOTE — Plan of Care (Signed)
Discharge teaching completed with patient's husband and daughter; patient is in stable condition.

## 2021-06-19 DIAGNOSIS — F0283 Dementia in other diseases classified elsewhere, unspecified severity, with mood disturbance: Secondary | ICD-10-CM | POA: Diagnosis not present

## 2021-06-19 DIAGNOSIS — R627 Adult failure to thrive: Secondary | ICD-10-CM | POA: Diagnosis not present

## 2021-06-19 DIAGNOSIS — R55 Syncope and collapse: Secondary | ICD-10-CM | POA: Diagnosis not present

## 2021-06-19 DIAGNOSIS — G309 Alzheimer's disease, unspecified: Secondary | ICD-10-CM | POA: Diagnosis not present

## 2021-06-19 DIAGNOSIS — Z681 Body mass index (BMI) 19 or less, adult: Secondary | ICD-10-CM | POA: Diagnosis not present

## 2021-06-19 DIAGNOSIS — R569 Unspecified convulsions: Secondary | ICD-10-CM | POA: Diagnosis not present

## 2021-07-13 DEATH — deceased

## 2023-09-30 IMAGING — CT CT HEAD W/O CM
3 series · 16 of 47 positions shown, 19 images · non-contrast
Comparison: Brain MRI 03/02/2019, CT head 01/10/2021

CLINICAL DATA: Syncope, possible seizure

EXAM:
CT HEAD WITHOUT CONTRAST
TECHNIQUE: Contiguous axial images were obtained from the base of the skull
through the vertex without intravenous contrast.

[Series 3: head wo · axial · 0.41mm/px · z∈[-107,+18]mm · 10 of 31 slices shown, 13 images]
[im 3/31  brain]
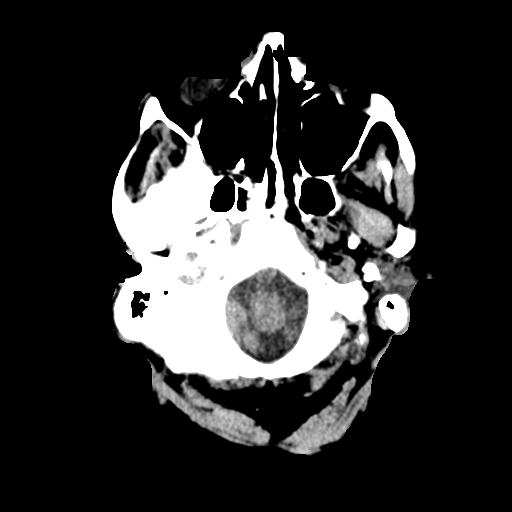
[im 3/31  bone]
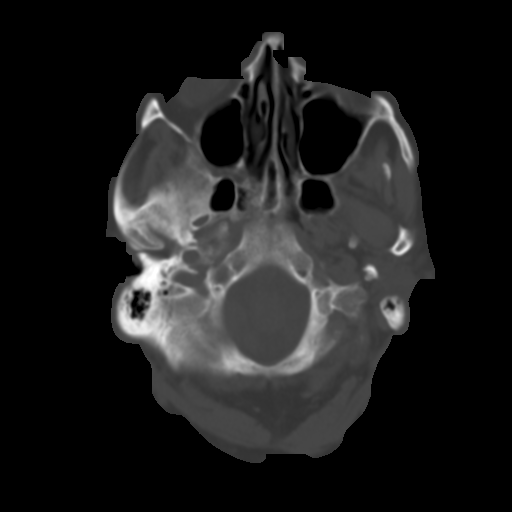
[im 6/31  brain]
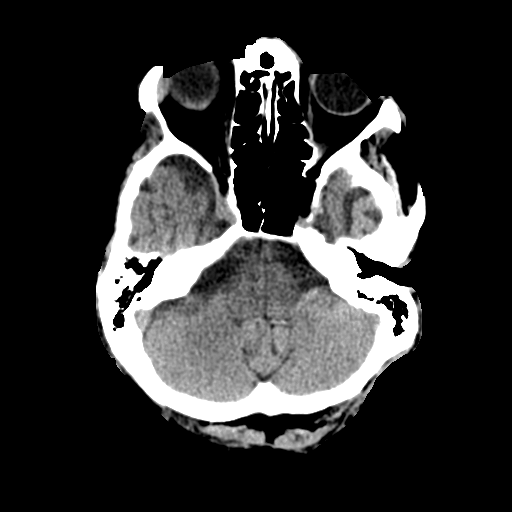
[im 9/31  brain]
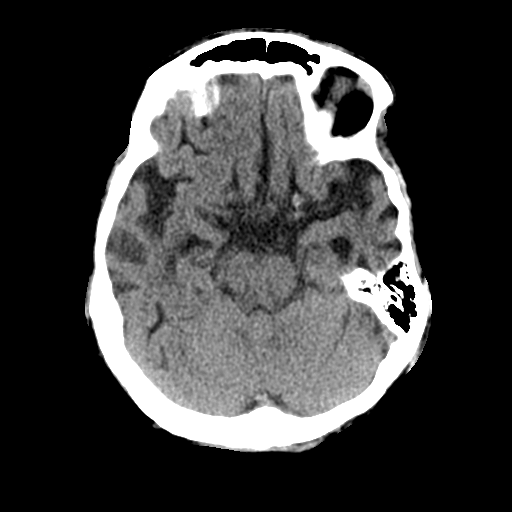
[im 11/31  brain]
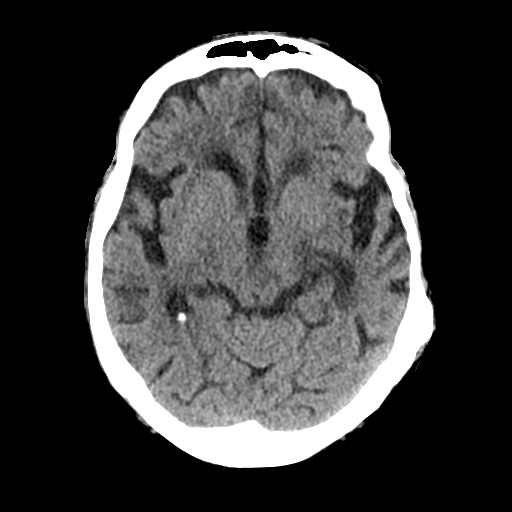
[im 14/31  brain]
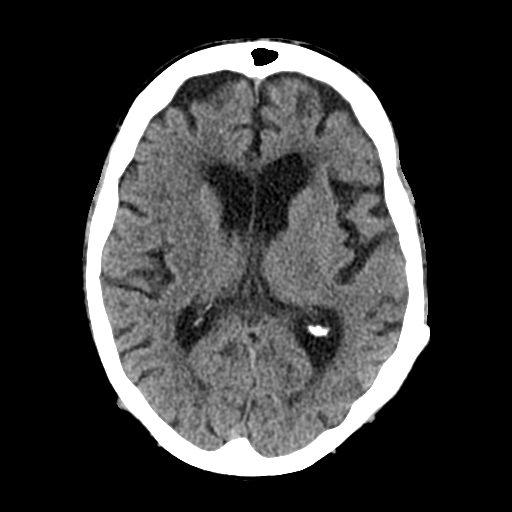
[im 14/31  bone]
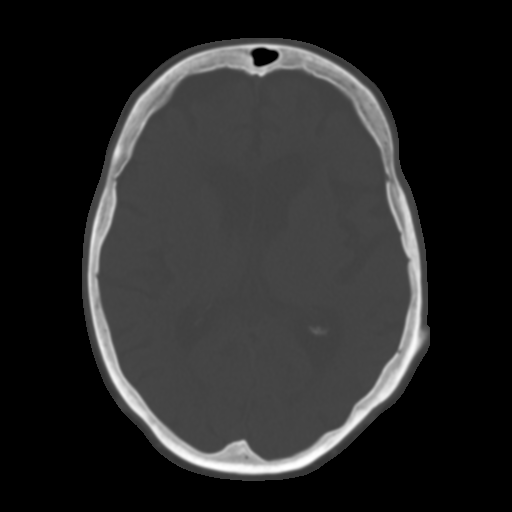
[im 17/31  brain]
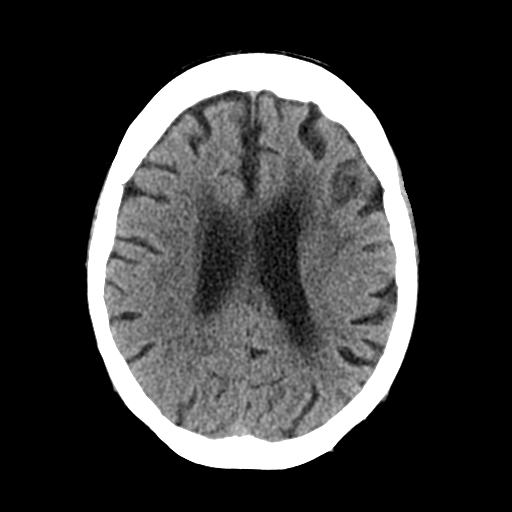
[im 20/31  brain]
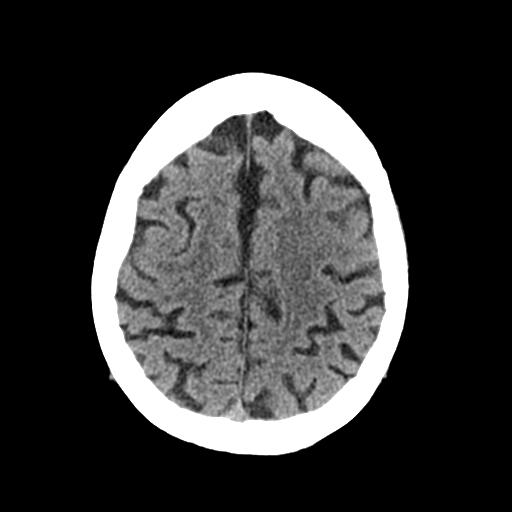
[im 23/31  brain]
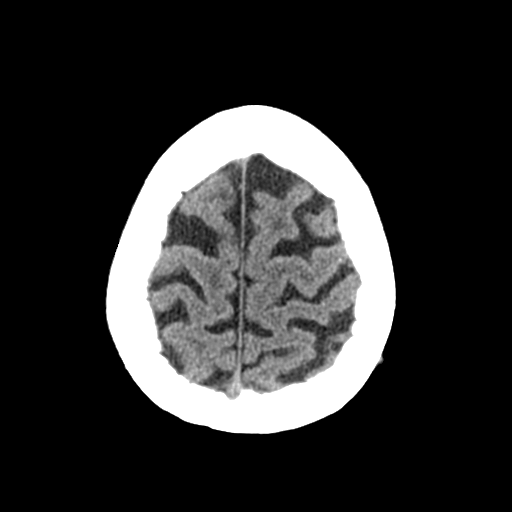
[im 25/31  brain]
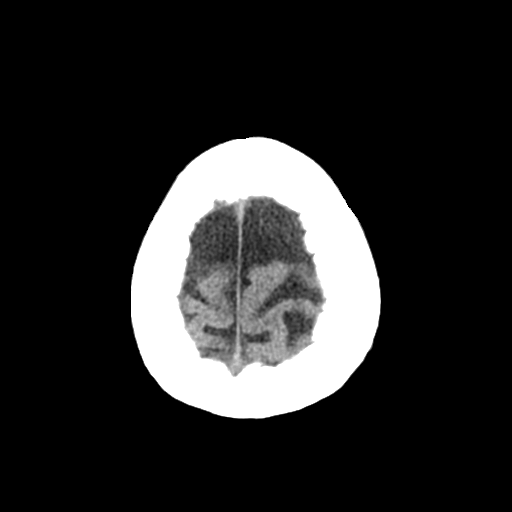
[im 25/31  bone]
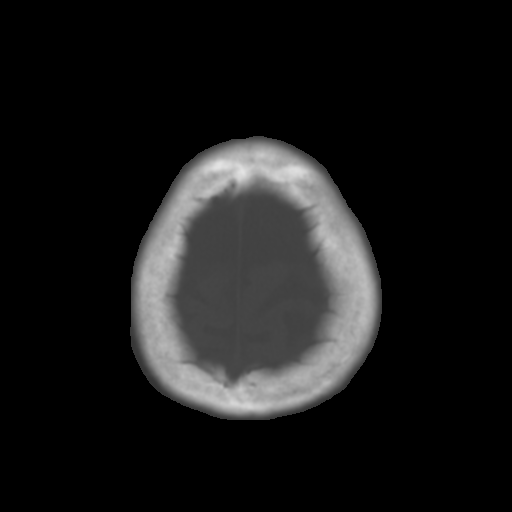
[im 28/31  brain]
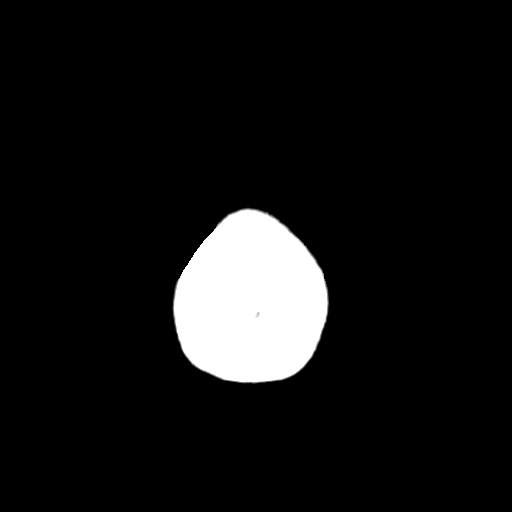

[Series 4: coronal soft tissue · coronal · 0.31mm/px · 3 of 63 slices shown]
[im 21/63  brain]
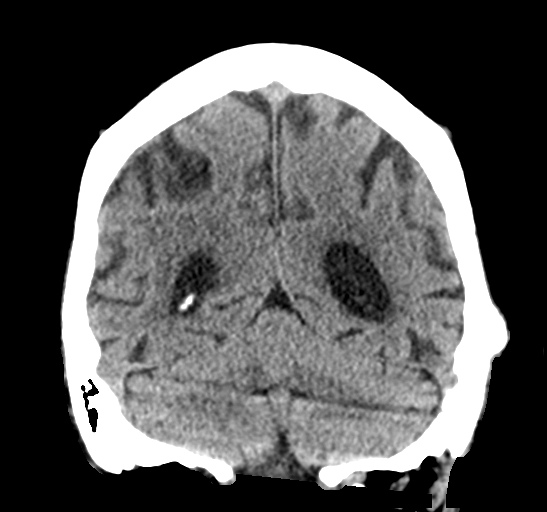
[im 28/63  brain]
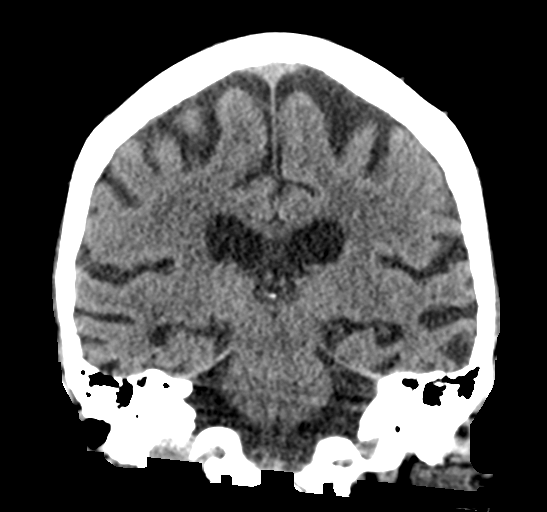
[im 35/63  brain]
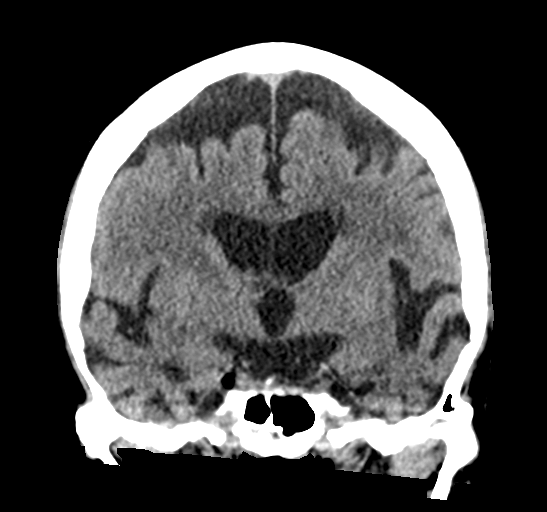

[Series 5: sagittal soft tissue · sagittal · 0.31mm/px · 3 of 57 slices shown]
[im 19/57  brain]
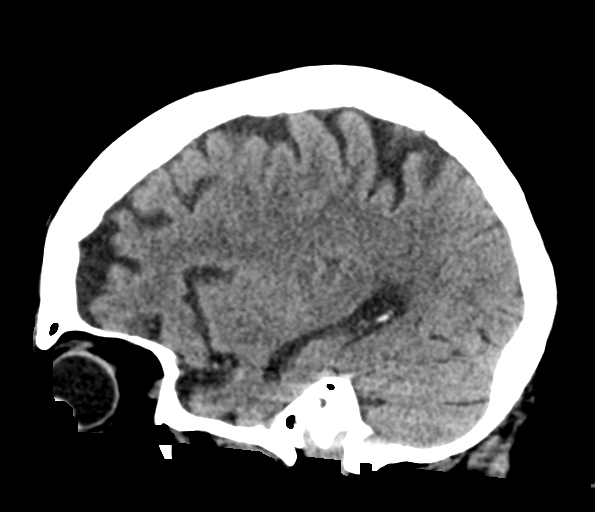
[im 29/57  brain]
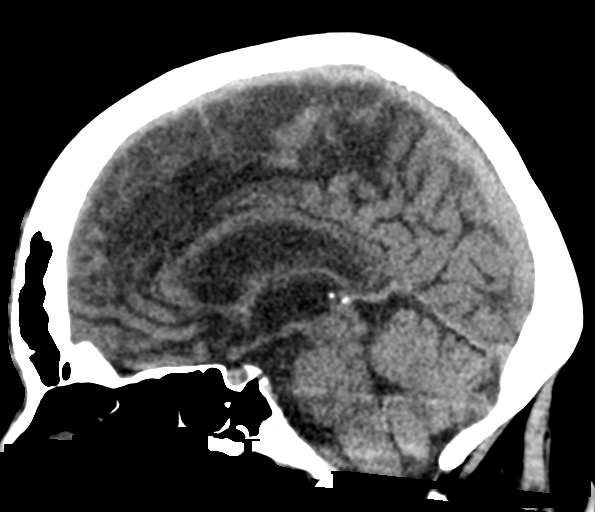
[im 38/57  brain]
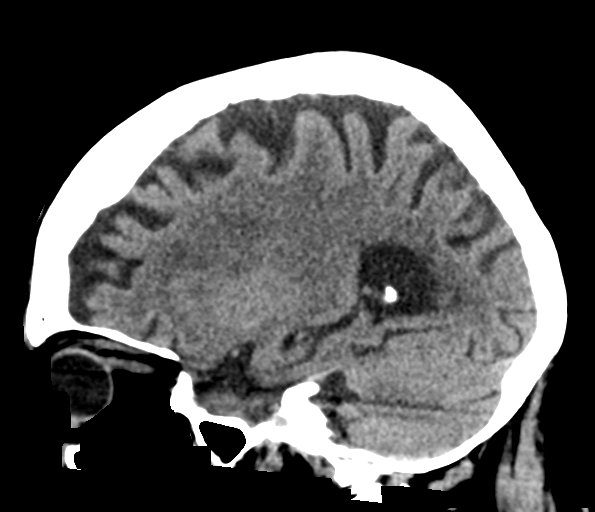

[16 of 47 positions shown; findings below may reference images not displayed]

FINDINGS: Brain: There is no acute intracranial hemorrhage, extra-axial fluid
collection, or acute infarct.

There is unchanged mild-to-moderate global parenchymal volume loss
with commensurate enlargement of the ventricular system. Foci of
hypodensity in the subcortical and periventricular white matter are
nonspecific but likely reflects sequela of chronic white matter
microangiopathy, unchanged.

There is no mass lesion.  There is no midline shift.

Vascular: No hyperdense vessel or unexpected calcification.

Skull: Normal. Negative for fracture or focal lesion.

Sinuses/Orbits: The imaged paranasal sinuses are clear. The globes
and orbits are unremarkable.

Other: None.
IMPRESSION: 1. No acute intracranial pathology.
2. Unchanged global parenchymal volume loss and chronic white matter
microangiopathy.
